# Patient Record
Sex: Male | Born: 1960 | Race: Black or African American | Hispanic: No | Marital: Single | Smoking: Current some day smoker
Health system: Southern US, Community
[De-identification: ages and names within clinical notes are randomized; demographics above are authoritative.]

## PROBLEM LIST (undated history)

## (undated) DIAGNOSIS — B20 Human immunodeficiency virus [HIV] disease: Secondary | ICD-10-CM

## (undated) DIAGNOSIS — F1910 Other psychoactive substance abuse, uncomplicated: Secondary | ICD-10-CM

## (undated) DIAGNOSIS — B49 Unspecified mycosis: Secondary | ICD-10-CM

## (undated) DIAGNOSIS — E785 Hyperlipidemia, unspecified: Secondary | ICD-10-CM

## (undated) DIAGNOSIS — I10 Essential (primary) hypertension: Secondary | ICD-10-CM

## (undated) DIAGNOSIS — Z21 Asymptomatic human immunodeficiency virus [HIV] infection status: Secondary | ICD-10-CM

## (undated) DIAGNOSIS — N200 Calculus of kidney: Secondary | ICD-10-CM

## (undated) HISTORY — PX: CARDIAC SURGERY: SHX584

## (undated) HISTORY — PX: HERNIA REPAIR: SHX51

---

## 2002-07-02 ENCOUNTER — Emergency Department (HOSPITAL_COMMUNITY): Admission: EM | Admit: 2002-07-02 | Discharge: 2002-07-02 | Payer: Self-pay | Admitting: Emergency Medicine

## 2002-07-02 ENCOUNTER — Encounter: Payer: Self-pay | Admitting: Emergency Medicine

## 2003-09-03 ENCOUNTER — Emergency Department (HOSPITAL_COMMUNITY): Admission: EM | Admit: 2003-09-03 | Discharge: 2003-09-03 | Payer: Self-pay | Admitting: Emergency Medicine

## 2012-07-24 HISTORY — PX: KIDNEY STONE SURGERY: SHX686

## 2013-07-28 ENCOUNTER — Encounter (HOSPITAL_COMMUNITY): Payer: Self-pay | Admitting: Emergency Medicine

## 2013-07-28 ENCOUNTER — Emergency Department (HOSPITAL_COMMUNITY)
Admission: EM | Admit: 2013-07-28 | Discharge: 2013-07-29 | Disposition: A | Discharge status: Other Acute Inpatient Hospital | Payer: Medicaid Other | Attending: Emergency Medicine | Admitting: Emergency Medicine

## 2013-07-28 DIAGNOSIS — F1420 Cocaine dependence, uncomplicated: Secondary | ICD-10-CM | POA: Insufficient documentation

## 2013-07-28 DIAGNOSIS — F17200 Nicotine dependence, unspecified, uncomplicated: Secondary | ICD-10-CM | POA: Insufficient documentation

## 2013-07-28 DIAGNOSIS — F1220 Cannabis dependence, uncomplicated: Secondary | ICD-10-CM | POA: Insufficient documentation

## 2013-07-28 DIAGNOSIS — F1920 Other psychoactive substance dependence, uncomplicated: Secondary | ICD-10-CM

## 2013-07-28 DIAGNOSIS — Z79899 Other long term (current) drug therapy: Secondary | ICD-10-CM | POA: Insufficient documentation

## 2013-07-28 DIAGNOSIS — R45850 Homicidal ideations: Secondary | ICD-10-CM | POA: Insufficient documentation

## 2013-07-28 DIAGNOSIS — F1010 Alcohol abuse, uncomplicated: Secondary | ICD-10-CM | POA: Insufficient documentation

## 2013-07-28 DIAGNOSIS — IMO0002 Reserved for concepts with insufficient information to code with codable children: Secondary | ICD-10-CM | POA: Insufficient documentation

## 2013-07-28 HISTORY — DX: Other psychoactive substance abuse, uncomplicated: F19.10

## 2013-07-28 HISTORY — DX: Human immunodeficiency virus (HIV) disease: B20

## 2013-07-28 HISTORY — DX: Hyperlipidemia, unspecified: E78.5

## 2013-07-28 HISTORY — DX: Essential (primary) hypertension: I10

## 2013-07-28 HISTORY — DX: Unspecified mycosis: B49

## 2013-07-28 HISTORY — DX: Asymptomatic human immunodeficiency virus (hiv) infection status: Z21

## 2013-07-28 HISTORY — DX: Calculus of kidney: N20.0

## 2013-07-28 LAB — RAPID URINE DRUG SCREEN, HOSP PERFORMED
Amphetamines: NOT DETECTED
Barbiturates: NOT DETECTED
Benzodiazepines: NOT DETECTED
Cocaine: POSITIVE — AB
OPIATES: NOT DETECTED
Tetrahydrocannabinol: POSITIVE — AB

## 2013-07-28 LAB — CBC WITH DIFFERENTIAL/PLATELET
BASOS ABS: 0 10*3/uL (ref 0.0–0.1)
Basophils Relative: 0 % (ref 0–1)
EOS PCT: 1 % (ref 0–5)
Eosinophils Absolute: 0 10*3/uL (ref 0.0–0.7)
HEMATOCRIT: 39.8 % (ref 39.0–52.0)
Hemoglobin: 14.1 g/dL (ref 13.0–17.0)
LYMPHS PCT: 23 % (ref 12–46)
Lymphs Abs: 1.9 10*3/uL (ref 0.7–4.0)
MCH: 32.6 pg (ref 26.0–34.0)
MCHC: 35.4 g/dL (ref 30.0–36.0)
MCV: 92.1 fL (ref 78.0–100.0)
MONO ABS: 0.7 10*3/uL (ref 0.1–1.0)
Monocytes Relative: 9 % (ref 3–12)
Neutro Abs: 5.4 10*3/uL (ref 1.7–7.7)
Neutrophils Relative %: 68 % (ref 43–77)
Platelets: 224 10*3/uL (ref 150–400)
RBC: 4.32 MIL/uL (ref 4.22–5.81)
RDW: 13.7 % (ref 11.5–15.5)
WBC: 8 10*3/uL (ref 4.0–10.5)

## 2013-07-28 LAB — COMPREHENSIVE METABOLIC PANEL
ALT: 50 U/L (ref 0–53)
AST: 32 U/L (ref 0–37)
Albumin: 4.1 g/dL (ref 3.5–5.2)
Alkaline Phosphatase: 84 U/L (ref 39–117)
BUN: 10 mg/dL (ref 6–23)
CO2: 29 meq/L (ref 19–32)
Calcium: 9.6 mg/dL (ref 8.4–10.5)
Chloride: 104 mEq/L (ref 96–112)
Creatinine, Ser: 1.11 mg/dL (ref 0.50–1.35)
GFR, EST AFRICAN AMERICAN: 88 mL/min — AB (ref >90)
GFR, EST NON AFRICAN AMERICAN: 76 mL/min — AB (ref >90)
GLUCOSE: 102 mg/dL — AB (ref 70–99)
Potassium: 3.5 mEq/L — ABNORMAL LOW (ref 3.7–5.3)
SODIUM: 144 meq/L (ref 137–147)
TOTAL PROTEIN: 7.8 g/dL (ref 6.0–8.3)
Total Bilirubin: 0.3 mg/dL (ref 0.3–1.2)

## 2013-07-28 LAB — ETHANOL: Alcohol, Ethyl (B): <11 mg/dL (ref 0–11)

## 2013-07-28 LAB — ACETAMINOPHEN LEVEL: Acetaminophen (Tylenol), Serum: <15 ug/mL (ref 10–30)

## 2013-07-28 LAB — SALICYLATE LEVEL: Salicylate Lvl: <2 mg/dL — ABNORMAL LOW (ref 2.8–20.0)

## 2013-07-28 MED ORDER — LORAZEPAM 1 MG PO TABS
1.0000 mg | ORAL_TABLET | Freq: Three times a day (TID) | ORAL | Status: DC | PRN
  Administered 2013-07-29: 1 mg via ORAL
  Filled 2013-07-28: qty 1

## 2013-07-28 MED ORDER — ACETAMINOPHEN 325 MG PO TABS: 650.0000 mg | ORAL_TABLET | ORAL | Status: DC | PRN
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
## 2013-07-28 NOTE — ED Notes (Signed)
Report given to Anna, RN 

## 2013-07-28 NOTE — ED Notes (Signed)
Pt states that he is here from [facility] to ge through detox from alcohol, cocaine, and marijauna. Pt states when he is high he does want to hurt others. Pt denies SI.

## 2013-07-28 NOTE — ED Notes (Signed)
Attempted to get pt. To place shoes in a belongings bag.  Pt. States "I don't need them stinking things. I will get my family to get me some more when I get out of here."  Shoes are currently in the trash in the pt.'s room.

## 2013-07-29 ENCOUNTER — Encounter (HOSPITAL_COMMUNITY): Payer: Self-pay | Admitting: *Deleted

## 2013-07-29 ENCOUNTER — Inpatient Hospital Stay (HOSPITAL_COMMUNITY)
Admission: AD | Admit: 2013-07-29 | Discharge: 2013-08-05 | DRG: 897 | Disposition: A | Discharge status: Home / Self Care | Payer: Medicaid Other | Source: Intra-hospital | Attending: Psychiatry | Admitting: Psychiatry

## 2013-07-29 ENCOUNTER — Encounter (HOSPITAL_COMMUNITY): Payer: Self-pay | Admitting: Emergency Medicine

## 2013-07-29 DIAGNOSIS — Z21 Asymptomatic human immunodeficiency virus [HIV] infection status: Secondary | ICD-10-CM | POA: Diagnosis present

## 2013-07-29 DIAGNOSIS — F1994 Other psychoactive substance use, unspecified with psychoactive substance-induced mood disorder: Secondary | ICD-10-CM | POA: Diagnosis present

## 2013-07-29 DIAGNOSIS — Z598 Other problems related to housing and economic circumstances: Secondary | ICD-10-CM

## 2013-07-29 DIAGNOSIS — E7800 Pure hypercholesterolemia, unspecified: Secondary | ICD-10-CM | POA: Diagnosis present

## 2013-07-29 DIAGNOSIS — I10 Essential (primary) hypertension: Secondary | ICD-10-CM | POA: Diagnosis present

## 2013-07-29 DIAGNOSIS — F1920 Other psychoactive substance dependence, uncomplicated: Principal | ICD-10-CM | POA: Diagnosis present

## 2013-07-29 DIAGNOSIS — F411 Generalized anxiety disorder: Secondary | ICD-10-CM | POA: Diagnosis present

## 2013-07-29 DIAGNOSIS — F1910 Other psychoactive substance abuse, uncomplicated: Secondary | ICD-10-CM | POA: Diagnosis present

## 2013-07-29 DIAGNOSIS — R45850 Homicidal ideations: Secondary | ICD-10-CM

## 2013-07-29 DIAGNOSIS — F1410 Cocaine abuse, uncomplicated: Secondary | ICD-10-CM | POA: Diagnosis present

## 2013-07-29 DIAGNOSIS — F17200 Nicotine dependence, unspecified, uncomplicated: Secondary | ICD-10-CM | POA: Diagnosis present

## 2013-07-29 DIAGNOSIS — Z5987 Material hardship due to limited financial resources, not elsewhere classified: Secondary | ICD-10-CM

## 2013-07-29 DIAGNOSIS — F39 Unspecified mood [affective] disorder: Secondary | ICD-10-CM | POA: Diagnosis present

## 2013-07-29 DIAGNOSIS — E785 Hyperlipidemia, unspecified: Secondary | ICD-10-CM | POA: Diagnosis present

## 2013-07-29 DIAGNOSIS — Z23 Encounter for immunization: Secondary | ICD-10-CM

## 2013-07-29 DIAGNOSIS — B353 Tinea pedis: Secondary | ICD-10-CM | POA: Diagnosis present

## 2013-07-29 MED ORDER — VALACYCLOVIR HCL 500 MG PO TABS
500.0000 mg | ORAL_TABLET | Freq: Two times a day (BID) | ORAL | Status: DC
  Administered 2013-07-29 – 2013-08-04 (×13): 500 mg via ORAL
  Filled 2013-07-29 (×11): qty 1
  Filled 2013-07-29: qty 28
  Filled 2013-07-29: qty 1
  Filled 2013-07-29: qty 28
  Filled 2013-07-29 (×5): qty 1

## 2013-07-29 MED ORDER — HYDROCHLOROTHIAZIDE 25 MG PO TABS
ORAL_TABLET | ORAL | Status: AC
  Filled 2013-07-29: qty 1

## 2013-07-29 MED ORDER — ADULT MULTIVITAMIN W/MINERALS CH
ORAL_TABLET | ORAL | Status: AC
  Administered 2013-07-29: 17:00:00 1 via ORAL
  Filled 2013-07-29: qty 1

## 2013-07-29 MED ORDER — TRAZODONE HCL 100 MG PO TABS
100.0000 mg | ORAL_TABLET | Freq: Every evening | ORAL | Status: DC | PRN
  Administered 2013-07-30 – 2013-08-03 (×5): 100 mg via ORAL
  Filled 2013-07-29 (×4): qty 1
  Filled 2013-07-29: qty 14
  Filled 2013-07-29: qty 1

## 2013-07-29 MED ORDER — INFLUENZA VAC SPLIT QUAD 0.5 ML IM SUSP
0.5000 mL | INTRAMUSCULAR | Status: AC
  Administered 2013-07-30: 0.5 mL via INTRAMUSCULAR
  Filled 2013-07-29: qty 0.5

## 2013-07-29 MED ORDER — VALACYCLOVIR HCL 500 MG PO TABS
500.0000 mg | ORAL_TABLET | Freq: Two times a day (BID) | ORAL | Status: DC
  Administered 2013-07-29: 500 mg via ORAL
  Filled 2013-07-29 (×4): qty 1

## 2013-07-29 MED ORDER — CALCIUM CARB-CHOLECALCIFEROL 600-200 MG-UNIT PO TABS: 1.0000 | ORAL_TABLET | Freq: Every day | ORAL | Status: DC

## 2013-07-29 MED ORDER — SIMVASTATIN 40 MG PO TABS
20.0000 mg | ORAL_TABLET | Freq: Every day | ORAL | Status: DC
  Administered 2013-07-29 – 2013-08-04 (×7): 20 mg via ORAL
  Filled 2013-07-29 (×8): qty 1
  Filled 2013-07-29: qty 0.5
  Filled 2013-07-29: qty 1

## 2013-07-29 MED ORDER — CHLORDIAZEPOXIDE HCL 25 MG PO CAPS: 25.0000 mg | ORAL_CAPSULE | Freq: Four times a day (QID) | ORAL | Status: DC | PRN

## 2013-07-29 MED ORDER — HYDROCHLOROTHIAZIDE 25 MG PO TABS
25.0000 mg | ORAL_TABLET | Freq: Every day | ORAL | Status: DC
  Administered 2013-07-29: 25 mg via ORAL
  Filled 2013-07-29: qty 1

## 2013-07-29 MED ORDER — MAGNESIUM HYDROXIDE 400 MG/5ML PO SUSP: 30.0000 mL | Freq: Every day | ORAL | Status: DC | PRN

## 2013-07-29 MED ORDER — EFAVIRENZ-EMTRICITAB-TENOFOVIR 600-200-300 MG PO TABS
1.0000 | ORAL_TABLET | Freq: Every day | ORAL | Status: DC
  Filled 2013-07-29: qty 1

## 2013-07-29 MED ORDER — EFAVIRENZ-EMTRICITAB-TENOFOVIR 600-200-300 MG PO TABS
1.0000 | ORAL_TABLET | Freq: Every day | ORAL | Status: DC
  Administered 2013-07-30 – 2013-08-05 (×7): 1 via ORAL
  Filled 2013-07-29 (×9): qty 1
  Filled 2013-07-29: qty 3

## 2013-07-29 MED ORDER — HYDROCHLOROTHIAZIDE 25 MG PO TABS
25.0000 mg | ORAL_TABLET | Freq: Every day | ORAL | Status: DC
  Administered 2013-07-29 – 2013-08-04 (×7): 25 mg via ORAL
  Filled 2013-07-29 (×6): qty 1
  Filled 2013-07-29: qty 14
  Filled 2013-07-29 (×3): qty 1

## 2013-07-29 MED ORDER — ADULT MULTIVITAMIN W/MINERALS CH
1.0000 | ORAL_TABLET | Freq: Every day | ORAL | Status: DC
Start: 2013-07-29 — End: 2013-08-05
  Administered 2013-07-29 – 2013-08-04 (×7): 1 via ORAL
  Filled 2013-07-29: qty 1
  Filled 2013-07-29: qty 14
  Filled 2013-07-29 (×8): qty 1

## 2013-07-29 MED ORDER — CALCIUM CARBONATE-VITAMIN D 500-200 MG-UNIT PO TABS
1.0000 | ORAL_TABLET | Freq: Every day | ORAL | Status: DC
  Administered 2013-07-30 – 2013-08-04 (×6): 1 via ORAL
  Filled 2013-07-29 (×8): qty 1
  Filled 2013-07-29: qty 14
  Filled 2013-07-29: qty 1

## 2013-07-29 MED ORDER — ACETAMINOPHEN 325 MG PO TABS
650.0000 mg | ORAL_TABLET | Freq: Four times a day (QID) | ORAL | Status: DC | PRN
  Administered 2013-07-30: 650 mg via ORAL
  Filled 2013-07-29: qty 2

## 2013-07-29 MED ORDER — ALUM & MAG HYDROXIDE-SIMETH 200-200-20 MG/5ML PO SUSP
30.0000 mL | ORAL | Status: DC | PRN
Start: 2013-07-29 — End: 2013-08-05

## 2013-07-29 MED ORDER — EFAVIRENZ-EMTRICITAB-TENOFOVIR 600-200-300 MG PO TABS
1.0000 | ORAL_TABLET | Freq: Once | ORAL | Status: AC
  Administered 2013-07-29: 1 via ORAL
  Filled 2013-07-29: qty 1

## 2013-07-29 NOTE — Progress Notes (Signed)
Adult Psychoeducational Group Note  Date:  07/29/2013 Time: 2000  Group Topic/Focus:  Wrap-Up Group:   The focus of this group is to help patients review their daily goal of treatment and discuss progress on daily workbooks.  Participation Level:  Active  Participation Quality:  Attentive and Redirectable  Affect:  Anxious  Cognitive:  Alert and Oriented  Insight: Good  Engagement in Group:  Supportive  Modes of Intervention:  Limit-setting and Support  Additional Comments:  Pt stated that this was his "first time in this environment" and "by the grace of God" he'll be able to get better.   Humberto SealsWhitaker, Ranvir Renovato Monique 07/29/2013, 9:29 PM

## 2013-07-29 NOTE — BH Assessment (Signed)
Assessment staff learned of need for tele assessment during Bridge Call with ED. Writer to complete
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
## 2013-07-29 NOTE — Progress Notes (Signed)
Patient ID: Brigitte PulseSamuel [name], male   DOB: 10/23/1960, 53 y.o.   MRN: [id number] [facility] Living Will, and he does not wish to complete a new one at this time.

## 2013-07-29 NOTE — Progress Notes (Signed)
Recreation Therapy Notes  Animal-Assisted Activity/Therapy (AAA/T) Program Checklist/Progress Notes Patient Eligibility Criteria Checklist & Daily Group note for Rec Tx Intervention  Date: 01.06.2015 Time: 2:45pm [address]    AAA/T Program Assumption of Risk Form signed by Patient/ or Parent Legal Guardian yes  Patient is free of allergies or sever asthma yes  Patient reports no fear of animals yes  Patient reports no history of cruelty to animals yes   Patient understands his/her participation is voluntary yes  Behavioral Response: Did not attend.   [name], LRT/CTRS  Hollyn Stucky L 07/29/2013 4:53 PM

## 2013-07-29 NOTE — Tx Team (Signed)
Initial Interdisciplinary Treatment Plan  PATIENT STRENGTHS: (choose at least two) Ability for insight Average or above average intelligence Capable of independent living Financial means  PATIENT STRESSORS: Legal issue Substance abuse   PROBLEM LIST: Problem List/Patient Goals Date to be addressed Date deferred Reason deferred Estimated date of resolution  Substance abuse 07/29/2013     Homicidal ideations 07/29/2013                                                DISCHARGE CRITERIA:  Improved stabilization in mood, thinking, and/or behavior Need for constant or close observation no longer present Withdrawal symptoms are absent or subacute and managed without 24-hour nursing intervention  PRELIMINARY DISCHARGE PLAN: Attend 12-step recovery group Outpatient therapy  PATIENT/FAMIILY INVOLVEMENT: This treatment plan has been presented to and reviewed with the patient, Ethan [name].  The patient and family have been given the opportunity to ask questions and make suggestions.  Leighton Parodyyson, Danniell Rotundo M 07/29/2013, 4:41 PM

## 2013-07-29 NOTE — BH Assessment (Signed)
Called and spoke with Dr Loretha StaplerWofford before tele assessment; will keep Dr Loretha StaplerWofford informed as to patient needs/disposiotion. Carney Bernatherine C Harrill, LCSW

## 2013-07-29 NOTE — BH Assessment (Signed)
Completed Tele Assessment with patient at 09:15 and ran patient with Fransisca KaufmannLaura Davis NP at [phone] who accepted patient. Charge RN Lupita LeashDonna accepted patient to 303/1. Dr Loretha StaplerWofford informed at 9:50 AM as was patient's RN, Kriste BasqueBecky who will arrange [place] transportation once patient signs supporting paperwork which Ethelene Brownsnthony in Dispositions agreed to complete with patient.  Carney Bernatherine C Nickolus Wadding, LCSW

## 2013-07-30 ENCOUNTER — Encounter (HOSPITAL_COMMUNITY): Payer: Self-pay | Admitting: Psychiatry

## 2013-07-30 DIAGNOSIS — F39 Unspecified mood [affective] disorder: Secondary | ICD-10-CM | POA: Diagnosis present

## 2013-07-30 MED ORDER — QUETIAPINE FUMARATE 50 MG PO TABS
50.0000 mg | ORAL_TABLET | Freq: Four times a day (QID) | ORAL | Status: DC | PRN
  Administered 2013-07-30 – 2013-08-03 (×7): 50 mg via ORAL
  Filled 2013-07-30 (×6): qty 1
  Filled 2013-07-30: qty 30
  Filled 2013-07-30: qty 1

## 2013-07-30 MED ORDER — IBUPROFEN 800 MG PO TABS
800.0000 mg | ORAL_TABLET | Freq: Four times a day (QID) | ORAL | Status: DC | PRN
  Administered 2013-07-30: 800 mg via ORAL
  Filled 2013-07-30: qty 1

## 2013-07-30 NOTE — Progress Notes (Signed)
Recreation Therapy Notes  Date: 01.07.2014 Time: 2:45pm Location: [address] Dayroom   Group Topic: Communication, Team Building, Problem Solving  Goal Area(s) Addresses:  Patient will effectively work with peer towards shared goal.  Patient will identify skill used to make activity successful.  Patient will identify how skills used during activity can be used to reach post d/c goals.   Behavioral Response: Did not attend.   Marykay Lexenise L Rhylen Shaheen, LRT/CTRS  Sarim Rothman L 07/30/2013 5:04 PM

## 2013-07-30 NOTE — [facility] Suicide Risk Assessment (Signed)
Suicide Risk Assessment  Admission Assessment     Nursing information obtained from:  Patient Demographic factors:  Male;Low socioeconomic status;Living alone;Unemployed Current Mental Status:  Thoughts of violence towards others Loss Factors:  NA Historical Factors:  NA Risk Reduction Factors:  NA  CLINICAL FACTORS:   Depression:   Comorbid alcohol abuse/dependence Impulsivity Alcohol/Substance Abuse/Dependencies  COGNITIVE FEATURES THAT CONTRIBUTE TO RISK:  Closed-mindedness Polarized thinking Thought constriction (tunnel vision)    SUICIDE RISK:   Moderate:  Frequent suicidal ideation with limited intensity, and duration, some specificity in terms of plans, no associated intent, good self-control, limited dysphoria/symptomatology, some risk factors present, and identifiable protective factors, including available and accessible social support.  PLAN OF CARE: Supportive approach/coping skills/relapse prevention                               Detox as needed                               Reassess and address the co morbidities/anger manangement  I certify that inpatient services furnished can reasonably be expected to improve the patient's condition.  Lamanda Rudder A 07/30/2013, 1:57 PM
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
## 2013-07-30 NOTE — Progress Notes (Signed)
Adult Psychoeducational Group Note  Date:  07/30/2013 Time:  9:42 PM  Group Topic/Focus:  NA group  Participation Level:  Active  Participation Quality:  Appropriate  Affect:  Appropriate  Cognitive:  Alert  Insight: Appropriate  Engagement in Group:  Engaged  Modes of Intervention:  Discussion  Additional Comments:    Flonnie HailstoneCOOKE, Merland Holness R 07/30/2013, 9:42 PM
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
## 2013-07-30 NOTE — Progress Notes (Signed)
D   Pt is pleasant and cooperative   He attends and participates in groups  And interacts appropriately with others   He demonstrates insight and maintains a positive attitude   He had no complaints and required no PRN medications from me A   Verbal support given   Medications offered and educated on withdrawal symptoms   Q 15 min checks R   Pt safe at present
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
## 2013-07-30 NOTE — [facility] Group Notes (Signed)
[facility] LCSW Group Therapy  07/30/2013 2:55 PM  Type of Therapy:  Group Therapy  Participation Level:  None-PT EXCUSED FROM GROUP WITHIN FIVE MINUTES DUE TO SLEEPING/SNORING  Smart, Ethan [name] LCSWA  07/30/2013, 2:55 PM
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
## 2013-07-31 LAB — T-HELPER CELLS (CD4) COUNT (NOT AT ARMC)
CD4 T CELL HELPER: 40 % (ref 33–55)
CD4 T Cell Abs: 860 /uL (ref 400–2700)

## 2013-07-31 NOTE — Progress Notes (Signed)
Patient ID: Ethan PulseSamuel [name], male   DOB: 05/30/1961, 53 y.o.   MRN: [id number] Pt attended AA group until he disagreed with the  Speaker and walked out mumbling,"That's my clue to leave". It was in reference to not using the name of Jesus.

## 2013-07-31 NOTE — Progress Notes (Signed)
Patient ID: Ethan PulseSamuel Gunkel, male   DOB: 06/12/1961, 53 y.o.   MRN: [id number] Pt did not attend the psycho-educational group on overcoming stress.
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
## 2013-07-31 NOTE — Progress Notes (Signed)
D: pt is calm and cooperative, flirtatious with male staff. Denies si/hi/avh. Denies pain. Pt has not said anything regarding hi towards landlord today. Writer washed pt shave. Pt attended nightly group.  A: medications given. q 15 min safety checks, reminded of therapeutic language, and easily direct able  R: pt remains safe on unit

## 2013-07-31 NOTE — [facility] Suicide Risk Assessment (Signed)
[facility] INPATIENT:  Family/Significant Other Suicide Prevention Education  Suicide Prevention Education:  Education Completed; Reverend Roma KayserFrederick Lucas (pt's reverend and friend) [phone] has been identified by the patient as the family member/significant other with whom the patient will be residing, and identified as the person(s) who will aid the patient in the event of a mental health crisis (suicidal ideations/suicide attempt).  With written consent from the patient, the family member/significant other has been provided the following suicide prevention education, prior to the and/or following the discharge of the patient.  The suicide prevention education provided includes the following:  Suicide risk factors  Suicide prevention and interventions  National Suicide Hotline telephone number  [facility] assessment telephone number  [facility] Emergency Assistance 911  [facility] and/or Residential Mobile Crisis Unit telephone number  Request made of family/significant other to:  Remove weapons (e.g., guns, rifles, knives), all items previously/currently identified as safety concern.    Remove drugs/medications (over-the-counter, prescriptions, illicit drugs), all items previously/currently identified as a safety concern.  The family member/significant other verbalizes understanding of the suicide prevention education information provided.  The family member/significant other agrees to remove the items of safety concern listed above.  Zorita PangReverend Lucas appeared minimally interested in pt's wellbeing but was encouraged to ask questions and list any concerns. Rev. Brogen BoucheLucas is currently handling pt's finances while pt is in treatment. SPE completed with Rev. Perrin BoucheLucas although not required, as pt was not endorsing SI during admission or during [facility]. PT also reports that he is no longer experiencing HI toward his landlords or anyone else. He stated that he plans to call his  landlord to "offer forgiveness and move on."   Smart, HeatherLCSWA  07/31/2013, 12:22 PM

## 2013-07-31 NOTE — [facility] Group Notes (Signed)
[facility] LCSW Group Therapy  07/31/2013 3:19 PM  Type of Therapy:  Group Therapy  Participation Level:  Active  Participation Quality:  Attentive  Affect:  Appropriate  Cognitive:  Oriented  Insight:  Improving  Engagement in Therapy:  Improving  Modes of Intervention:  Confrontation, Discussion, Education, Exploration, Problem-solving, Role-play, Socialization and Support  Summary of Progress/Problems:  Finding Balance in Life. Today's group focused on defining balance in one's own words, identifying things that can knock one off balance, and exploring healthy ways to maintain balance in life. Group members were asked to provide an example of a time when they felt off balance, describe how they handled that situation,and process healthier ways to regain balance in the future. Group members were asked to share the most important tool for maintaining balance that they learned [facility] and how they plan to apply this method after discharge. Ethan [name] was engaged and attentive throughout today's therapy group. He presents with pleasant mood and calm affect. Ethan [name] stated that to him, balance meant "not trusting bad people and being careful." Ethan [name] shows progress in the group setting AEB his ability to actively participate in group discussion. He demonstrates improving insight AEB his ability to process how having positive supports will help in while in recovery. Ethan [name] shared that he plans [facility]. "I feel like I will do well in a christian home with other people going through the same things I am." Ethan [name] identified one thing that he plans to do daily to help him reestablish a sense of balance in life. "I plan to [place] [facility] and [place]. I also plan to stay in the Word and remain weary of others' intentions so that noone takes advantage of me in the future."    Smart, Lebron QuamHeather LCSWA  07/31/2013, 3:19 PM

## 2013-07-31 NOTE — Progress Notes (Signed)
Recreation Therapy Notes  Animal-Assisted Activity/Therapy (AAA/T) Program Checklist/Progress Notes Patient Eligibility Criteria Checklist & Daily Group note for Rec Tx Intervention  Date: 01.08.2014 Time: 2:45pm Location: [address] Dayroom   AAA/T Program Assumption of Risk Form signed by Patient/ or Parent Legal Guardian yes  Patient is free of allergies or sever asthma yes  Patient reports no fear of animals yes  Patient reports no history of cruelty to animals yes   Patient understands his/her participation is voluntary yes  Patient washes hands before animal contact yes  Patient washes hands after animal contact yes  Behavioral Response: Engaged, Appropriate   Education: Hand Washing, Appropriate Animal Interaction   Education Outcome: Acknowledges understanding   Clinical Observations/Feedback: Patient interacted appropriately with therapy dog team.    Cashmere Harmes L Dinh Ayotte, LRT/CTRS  Lira Stephen L 07/31/2013 5:00 PM 

## 2013-07-31 NOTE — Clinical Social Work Note (Signed)
Pt requested that CSW fax letter to his lawyer Corie ChiquitoCandace Marton (fax: [phone]) stating that [facility] and plans to [facility]/hopes to be accepted [facility] after treatment. Pt given original letter and fax confirmation. Fax sent: 3:45PM on 07/31/13. Pt signed ROI for Candace Marton.    The Sherwin-WilliamsHeather Smart, LCSWA 07/31/2013 3:52 PM

## 2013-07-31 NOTE — Progress Notes (Addendum)
D:  Patient's self inventory sheet, patient has fair sleep, improving appetite, low energy level, improving attention span.  Rated depression 8, hopeless 6.  Has experienced chilling in past 24 hours.  Denied SI.  Has experienced lightheadedness, dizziness in past 24 hours.  Worst pain #6.  Plans not to return [facility] after discharge.  No discharge plans.  No problems taking meds after discharge.   "I love Jesus, Christ."  A:  Medications administered per MD orders.  Emotional support and encouragement given patient. R:  Denied SI and HI, contracts for safety.  Denied A/V hallucinations.  Denied pain.  Will continue to monitor patient for safety with 15 minute checks.  Safety maintained.

## 2013-07-31 NOTE — Progress Notes (Signed)
Patient ID: Ethan PulseSamuel Hults, male   DOB: 11/28/1960, 53 y.o.   MRN: [id number] Pt did not attend morning wellness group with RN at 9 am.

## 2013-07-31 NOTE — Clinical Social Work Note (Signed)
Pt informed of [facility] Screening and possible Admission on Tuesday 08/05/13. CSW also provided pt with contact info for [facility] and was encouraged to call this recovery house to schedule phone interview and check for any openings as potential placement for after he [facility]. Pt pleasant and cooperative during interaction and stated that he thinks this would be a good transition after inpatient treatment and a good fit for him. Pt stated that his medications have helped him with mood stabilization. Pt stated that he no longer feels homicidal toward his landlords and plans to call them today to forgive them and tell them that he does not plan to return to the home.   The Sherwin-WilliamsHeather Smart, LCSWA 07/31/2013 10:45 AM

## 2013-08-01 DIAGNOSIS — F411 Generalized anxiety disorder: Secondary | ICD-10-CM

## 2013-08-01 DIAGNOSIS — F3289 Other specified depressive episodes: Secondary | ICD-10-CM

## 2013-08-01 DIAGNOSIS — F329 Major depressive disorder, single episode, unspecified: Secondary | ICD-10-CM

## 2013-08-01 DIAGNOSIS — F39 Unspecified mood [affective] disorder: Secondary | ICD-10-CM

## 2013-08-01 MED ORDER — DM-GUAIFENESIN ER 30-600 MG PO TB12
1.0000 | ORAL_TABLET | Freq: Two times a day (BID) | ORAL | Status: DC
  Administered 2013-08-01 – 2013-08-04 (×8): 1 via ORAL
  Filled 2013-08-01 (×6): qty 1
  Filled 2013-08-01: qty 10
  Filled 2013-08-01: qty 1
  Filled 2013-08-01: qty 10
  Filled 2013-08-01 (×6): qty 1

## 2013-08-01 NOTE — Progress Notes (Signed)
D.  Pt pleasant and appropriate on approach, denies complaints other than occasional insomnia at this time.  Positive for evening AA group with appropriate interaction.  Interacting appropriately within milieu.  Denies SI/HI/hallucinations at present.  A.  Support and encouragement offered, medications given as ordered  R.  Pt remains safe on unit, will continue to monitor.

## 2013-08-01 NOTE — Progress Notes (Signed)
Patient ID: Ethan PulseSamuel Blizzard, male   DOB: 07/01/1961, 53 y.o.   MRN: [id number] D: pt is calm and cooperative. He did make an inappropriate racial remark this am prior to am medications.  Denies si/hi/avh. Denies pain. Pt has not said anything regarding hi towards landlord today. Pt attended groups. He did complain congestion.  A: medications given. RN obtained an order for mucinex for the congestion.  Q 15 min safety checks, reminded of therapeutic language, and easily redirectable.  R: pt remains safe on unit. Pt refused to fill out his inventory sheet.

## 2013-08-01 NOTE — [facility] Group Notes (Signed)
Adult Psychoeducational Group Note  Date:  08/01/2013 Time:  10:33 PM  Group Topic/Focus:  AA Meeting  Participation Level:  Active  Participation Quality:  Appropriate  Affect:  Appropriate  Cognitive:  Appropriate  Insight: Appropriate  Engagement in Group:  Engaged  Modes of Intervention:  Discussion and Education  Additional Comments:  [name] attended Morgan StanleyA meeting.  Caroll RancherLindsay, Shanvi Moyd A 08/01/2013, 10:33 PM

## 2013-08-01 NOTE — [facility] Group Notes (Signed)
[facility] LCS[facility] Discharge Planning Group Note   08/01/2013 9:25 AM  Participation Quality:  Appropriate   Mood/Affect:  Labile  Depression Rating:  6  Anxiety Rating:  6  Thoughts of Suicide:  No Will you contract for safety?   NA  Current AVH:  No  Plan for Discharge/Comments:  Pt reports that he is slightly agitated this morning but states that the medication has helped his irritability significantly. [facility] and plans to [facility] into [facility] after [facility]. [facility]. [place] [place]   Transportation Means: bus   Supports: Education officer, environmentalpastor and friend   Counselling psychologistmart, OncologistHeather LCSWA

## 2013-08-01 NOTE — Progress Notes (Signed)
[facility] Group Notes:  (Nursing/MHT/Case Management/Adjunct)  Date:  07/31/2013  Time:  2100 Type of Therapy:  wrap up group  Participation Level:  Active  Participation Quality:  Attentive, Intrusive, Redirectable, Sharing and Supportive  Affect:  Appropriate  Cognitive:  Alert and Appropriate  Insight:  Improving  Engagement in Group:  Distracting and Engaged  Modes of Intervention:  Clarification, Education and Support  Summary of Progress/Problems:Pt requests information on [facility]. Pt wants to return to [facility] after long term treatment and reorganize a church that he once belonged to.   Shelah LewandowskySquires, Zakaria Sedor Carol 08/01/2013, 2:44 AM
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
## 2013-08-01 NOTE — [facility] Group Notes (Signed)
[facility] LCSW Group Therapy  08/01/2013 3:05 PM  Type of Therapy:  Group Therapy  Participation Level:  Minimal  Participation Quality:  Drowsy  Affect:  Lethargic  Cognitive:  Lacking  Insight:  Limited  Engagement in Therapy:  Limited  Modes of Intervention:  Confrontation, Discussion, Education, Exploration, Socialization and Support  Summary of Progress/Problems: Feelings around Relapse. Group members discussed the meaning of relapse and shared personal stories of relapse, how it affected them and others, and how they perceived themselves during this time. Group members were encouraged to identify triggers, warning signs and coping skills used when facing the possibility of relapse. Social supports were discussed and explored in detail. Post Acute Withdrawal Syndrome (handout provided) was introduced and examined. Pt's were encouraged to ask questions, talk about key points associated with PAWS, and process this information in terms of relapse prevention. Ethan [name] was inattentive and disengaged in the group setting today. He was lethargic and fell asleep during group. Ethan [name] stated that he thinks his medication is making him "groggy." Ethan [name] shows limited progress in the group setting at this time.    Smart, Jaqueline Uber LCSWA  08/01/2013, 3:05 PM

## 2013-08-02 DIAGNOSIS — F1910 Other psychoactive substance abuse, uncomplicated: Secondary | ICD-10-CM

## 2013-08-02 DIAGNOSIS — F39 Unspecified mood [affective] disorder: Secondary | ICD-10-CM

## 2013-08-02 MED ORDER — AMOXICILLIN 500 MG PO CAPS
500.0000 mg | ORAL_CAPSULE | Freq: Three times a day (TID) | ORAL | Status: DC
  Administered 2013-08-02 – 2013-08-05 (×9): 500 mg via ORAL
  Filled 2013-08-02 (×6): qty 1
  Filled 2013-08-02 (×2): qty 17
  Filled 2013-08-02 (×2): qty 1
  Filled 2013-08-02: qty 17
  Filled 2013-08-02 (×4): qty 1

## 2013-08-02 NOTE — Progress Notes (Signed)
[facility] MD Progress Note  08/02/2013 11:28 AM Ethan [name]  MRN:  [id number] Subjective:  Sleep was good, appetite "Ok", depression remains.  He is not feeling well, respiratory congestion, nausea, feeling hot and cold--due to his compromised state, Amoxicillin ordered per his request and his MDs usually give this to him to "clear it up". Diagnosis:   DSM5:  Substance/Addictive Disorders:  Alcohol Related Disorder - Severe (303.90), Alcohol Intoxication with Use Disorder - Severe (F10.229), Alcohol Withdrawal (291.81) and Opioid Disorder - Severe (304.00) Depressive Disorders:  Major Depressive Disorder - Mild (296.21)  Axis I: Anxiety Disorder NOS, Depressive Disorder NOS, Substance Abuse and Substance Induced Mood Disorder Axis II: Deferred Axis III:  Past Medical History  Diagnosis Date  . Substance abuse   . HIV infection   . Hypertension   . Hyperlipemia   . Fungal infection     on hand  . Kidney stone    Axis IV: other psychosocial or environmental problems, problems related to social environment and problems with primary support group Axis V: 41-50 serious symptoms  ADL's:  Intact  Sleep: Good  Appetite:  Fair  Suicidal Ideation:  Plan:  vague Intent:  none Means:  none Homicidal Ideation:  Denies   Psychiatric Specialty Exam: Review of Systems  Constitutional: Negative.   HENT: Negative.   Eyes: Negative.   Respiratory: Negative.   Cardiovascular: Negative.   Gastrointestinal: Negative.   Genitourinary: Negative.   Musculoskeletal: Negative.   Skin: Negative.   Neurological: Negative.   Endo/Heme/Allergies: Negative.   Psychiatric/Behavioral: Positive for depression. The patient is nervous/anxious.     Blood pressure 124/84, pulse 73, temperature 98.3 F (36.8 C), temperature source Oral, resp. rate 20, height 5' 9.29" (1.76 m), weight 104.327 kg (230 lb), SpO2 100.00%.Body mass index is 33.68 kg/(m^2).  General Appearance: Disheveled  Eye Solicitor::   Fair  Speech:  Normal Rate  Volume:  Normal  Mood:  Anxious and Depressed  Affect:  Congruent  Thought Process:  Coherent  Orientation:  Full (Time, Place, and Person)  Thought Content:  WDL  Suicidal Thoughts:  Yes.  without intent/plan  Homicidal Thoughts:  No  Memory:  Immediate;   Fair Recent;   Fair Remote;   Fair  Judgement:  Fair  Insight:  Fair  Psychomotor Activity:  Decreased  Concentration:  Fair  Recall:  Fair  Akathisia:  No  Handed:  Right  AIMS (if indicated):     Assets:  Resilience  Sleep:  Number of Hours: 5.25   Current Medications: Current Facility-Administered Medications  Medication Dose Route Frequency Provider Last Rate Last Dose  . acetaminophen (TYLENOL) tablet 650 mg  650 mg Oral Q6H PRN Fransisca Kaufmann, NP   650 mg at 07/30/13 0555  . alum & mag hydroxide-simeth (MAALOX/MYLANTA) 200-200-20 MG/5ML suspension 30 mL  30 mL Oral Q4H PRN Fransisca Kaufmann, NP      . amoxicillin (AMOXIL) capsule 500 mg  500 mg Oral Q8H Nanine Means, NP      . calcium-vitamin D (OSCAL WITH D) 500-200 MG-UNIT per tablet 1 tablet  1 tablet Oral Q breakfast Rachael Fee, MD   1 tablet at 08/02/13 [phone]  . chlordiazePOXIDE (LIBRIUM) capsule 25 mg  25 mg Oral QID PRN Sanjuana Kava, NP      . dextromethorphan-guaiFENesin (MUCINEX DM) 30-600 MG per 12 hr tablet 1 tablet  1 tablet Oral BID Rachael Fee, MD   1 tablet at 08/02/13 0825  . efavirenz-emtricitabine-tenofovir (ATRIPLA) 600-200-300  MG per tablet 1 tablet  1 tablet Oral QAC breakfast Sanjuana KavaAgnes I Nwoko, NP   1 tablet at 08/02/13 0605  . hydrochlorothiazide (HYDRODIURIL) tablet 25 mg  25 mg Oral Daily Sanjuana KavaAgnes I Nwoko, NP   25 mg at 08/02/13 0825  . ibuprofen (ADVIL,MOTRIN) tablet 800 mg  800 mg Oral Q6H PRN Sanjuana KavaAgnes I Nwoko, NP   800 mg at 07/30/13 1521  . magnesium hydroxide (MILK OF MAGNESIA) suspension 30 mL  30 mL Oral Daily PRN Fransisca KaufmannLaura Davis, NP      . multivitamin with minerals tablet 1 tablet  1 tablet Oral Daily Sanjuana KavaAgnes I Nwoko, NP   1  tablet at 08/02/13 0825  . QUEtiapine (SEROQUEL) tablet 50 mg  50 mg Oral Q6H PRN Rachael FeeIrving A Lugo, MD   50 mg at 08/01/13 2124  . simvastatin (ZOCOR) tablet 20 mg  20 mg Oral q1800 Sanjuana KavaAgnes I Nwoko, NP   20 mg at 08/01/13 1842  . traZODone (DESYREL) tablet 100 mg  100 mg Oral QHS PRN Fransisca KaufmannLaura Davis, NP   100 mg at 08/01/13 2124  . valACYclovir (VALTREX) tablet 500 mg  500 mg Oral BID Rachael FeeIrving A Lugo, MD   500 mg at 08/02/13 0827    Lab Results: No results found for this or any previous visit (from the past 48 hour(s)).  Physical Findings: AIMS: Facial and Oral Movements Muscles of Facial Expression: None, normal Lips and Perioral Area: None, normal Jaw: None, normal Tongue: None, normal,Extremity Movements Upper (arms, wrists, hands, fingers): None, normal Lower (legs, knees, ankles, toes): None, normal, Trunk Movements Neck, shoulders, hips: None, normal, Overall Severity Severity of abnormal movements (highest score from questions above): None, normal Incapacitation due to abnormal movements: None, normal Patient's awareness of abnormal movements (rate only patient's report): No Awareness, Dental Status Current problems with teeth and/or dentures?: No Does patient usually wear dentures?: No  CIWA:  CIWA-Ar Total: 2 COWS:  COWS Total Score: 1  Treatment Plan Summary: Daily contact with patient to assess and evaluate symptoms and progress in treatment Medication management  Plan:  Review of chart, vital signs, medications, and notes. 1-Individual and group therapy 2-Medication management for depression and anxiety:  Medications reviewed with the patient and amoxicillin 500 mg TID ordered for URI 3-Coping skills for depression, anxiety, and substance dependency 4-Continue crisis stabilization and management 5-Address health issues--monitoring vital signs, stable 6-Treatment plan in progress to prevent relapse of depression, substance use, and anxiety  Medical Decision Making Problem  Points:  Established problem, stable/improving (1) and Review of psycho-social stressors (1) Data Points:  Review of medication regiment & side effects (2)  I certify that inpatient services furnished can reasonably be expected to improve the patient's condition.   Nanine MeansLORD, JAMISON, PMH-NP 08/02/2013, 11:28 AM

## 2013-08-02 NOTE — [facility] Group Notes (Signed)
[facility] Group Notes:  (Nursing/MHT/Case Management/Adjunct)  Date:  08/02/2013  Time:  0900  Type of Therapy:  Nurse Education  Participation Level:  Did Not Attend  Participation Quality:  did not attend  Affect:  did not attend  Cognitive:  did not attend  Insight:  None  Engagement in Group:  did not attend  Modes of Intervention:  did not attend  Summary of Progress/Problems:  Ethan [name], Ethan [name] 08/02/2013, 10:34 AM

## 2013-08-02 NOTE — Progress Notes (Signed)
Seen and agreed. Jehad Bisono, MD 

## 2013-08-02 NOTE — [facility] Group Notes (Signed)
[facility] Group Notes: (Clinical Social Work)   08/02/2013      Type of Therapy:  Group Therapy   Participation Level:  Did Not Attend - was initially in the room but left    Ambrose MantleMareida Grossman-Orr, LCSW 08/02/2013, 12:31 PM
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
## 2013-08-03 MED ORDER — TERBINAFINE HCL 1 % EX CREA
TOPICAL_CREAM | Freq: Two times a day (BID) | CUTANEOUS | Status: DC
  Administered 2013-08-03 – 2013-08-04 (×2): via TOPICAL
  Filled 2013-08-03 (×2): qty 12

## 2013-08-03 NOTE — Progress Notes (Signed)
Patient did attend the evening speaker AA meeting.  

## 2013-08-03 NOTE — Progress Notes (Signed)
[facility] Group Notes:  (Nursing/MHT/Case Management/Adjunct)  Date:  08/03/2013  Time:  2:46 PM  Type of Therapy:  Psychoeducational Skills  Participation Level:  Active  Participation Quality:  Attentive and Redirectable  Affect:  Appropriate  Cognitive:  Appropriate  Insight:  Lacking  Engagement in Group:  Distracting, Engaged and Off Topic  Modes of Intervention:  Activity  Summary of Progress/Problems: Pts played a game of Pictionary using coping skills. Pt was inappropriate at times and difficult to redirect.   Caswell CorwinOwen, Daniah Zaldivar C 08/03/2013, 2:46 PM

## 2013-08-03 NOTE — [facility] Group Notes (Signed)
[facility] Group Notes:  (Clinical Social Work)  08/03/2013  10:00-11:00AM  Summary of Progress/Problems:   The main focus of today's process group was to   identify the patient's current support system and decide on other supports that can be put in place.  The picture on workbook was used to discuss why additional supports are needed, and a hand-out was distributed with four definitions/levels of support, then used to talk about how patients have given and received all different kinds of support.  An emphasis was placed on using counselor, doctor, therapy groups, 12-step groups, and problem-specific support groups to expand supports.   There was also an extensive discussion about what constitutes a healthy support versus an unhealthy support.  The patient expressed full comprehension of the concepts presented, and agreed that there is a need to add more supports.  The patient stated the current supports in place are his church and he is willing to add a counselor.  He remained sedated and complained of over-sedation.  Type of Therapy:  Process Group with Motivational Interviewing  Participation Level:  Minimal  Participation Quality:  Drowsy  Affect:  Blunted and Irritable  Cognitive:  Oriented  Insight:  Improving  Engagement in Therapy:  Improving  Modes of Intervention:   Education, Support and Processing, Activity  Ambrose MantleMareida Grossman-Orr, LCSW 08/03/2013, 12:15pm

## 2013-08-03 NOTE — Progress Notes (Signed)
Patient ID: Ethan [name], male   DOB: 1961/02/10, 53 y.o.   MRN: [id number] [facility] MD Progress Note  08/03/2013 6:41 PM Maddax Palinkas  MRN:  [id number]  Subjective:  Shahzain reports that he is ready to go to [facility] in am. He says he is tired of listening to the pill popping devils that call themselves patients. Requested a pair of shoes to wear to [facility] as he has no shoes to wear. Eduardo requested an order for Lamisil for his athletes feet.   Diagnosis:   DSM5: Substance/Addictive Disorders:  Alcohol Related Disorder - Severe (303.90), Alcohol Intoxication with Use Disorder - Severe (F10.229), Alcohol Withdrawal (291.81) and Opioid Disorder - Severe (304.00) Depressive Disorders:  Major Depressive Disorder - Mild (296.21)  Axis I: Anxiety Disorder NOS, Depressive Disorder NOS, Substance Abuse and Substance Induced Mood Disorder Axis II: Deferred Axis III:  Past Medical History  Diagnosis Date  . Substance abuse   . HIV infection   . Hypertension   . Hyperlipemia   . Fungal infection     on hand  . Kidney stone    Axis IV: other psychosocial or environmental problems, problems related to social environment and problems with primary support group Axis V: 41-50 serious symptoms  ADL's:  Intact  Sleep: Good  Appetite:  Fair  Suicidal Ideation:  Plan:  vague Intent:  none Means:  none Homicidal Ideation:  Denies   Psychiatric Specialty Exam: Review of Systems  Constitutional: Negative.   HENT: Negative.   Eyes: Negative.   Respiratory: Negative.   Cardiovascular: Negative.   Gastrointestinal: Negative.   Genitourinary: Negative.   Musculoskeletal: Negative.   Skin: Negative.   Neurological: Negative.   Endo/Heme/Allergies: Negative.   Psychiatric/Behavioral: Positive for depression. The patient is nervous/anxious.     Blood pressure 128/80, pulse 66, temperature 97.4 F (36.3 C), temperature source Oral, resp. rate 18, height 5' 9.29" (1.76  m), weight 104.327 kg (230 lb), SpO2 100.00%.Body mass index is 33.68 kg/(m^2).  General Appearance: Casual  Eye Contact::  Fair  Speech:  Normal Rate  Volume:  Normal  Mood:  Improved  Affect:  Congruent  Thought Process:  Coherent  Orientation:  Full (Time, Place, and Person)  Thought Content:  WDL  Suicidal Thoughts:  No  Homicidal Thoughts:  No  Memory:  Immediate;   Fair Recent;   Fair Remote;   Fair  Judgement:  Fair  Insight:  Fair  Psychomotor Activity:  Normal  Concentration:  Fair  Recall:  Fair  Akathisia:  No  Handed:  Right  AIMS (if indicated):     Assets:  Resilience  Sleep:  Number of Hours: 2.75   Current Medications: Current Facility-Administered Medications  Medication Dose Route Frequency Provider Last Rate Last Dose  . acetaminophen (TYLENOL) tablet 650 mg  650 mg Oral Q6H PRN Fransisca Kaufmann, NP   650 mg at 07/30/13 0555  . alum & mag hydroxide-simeth (MAALOX/MYLANTA) 200-200-20 MG/5ML suspension 30 mL  30 mL Oral Q4H PRN Fransisca Kaufmann, NP      . amoxicillin (AMOXIL) capsule 500 mg  500 mg Oral Q8H Nanine Means, NP   500 mg at 08/03/13 1254  . calcium-vitamin D (OSCAL WITH D) 500-200 MG-UNIT per tablet 1 tablet  1 tablet Oral Q breakfast Rachael Fee, MD   1 tablet at 08/03/13 [phone]  . chlordiazePOXIDE (LIBRIUM) capsule 25 mg  25 mg Oral QID PRN Sanjuana Kava, NP      . dextromethorphan-guaiFENesin [facility]  DM) 30-600 MG per 12 hr tablet 1 tablet  1 tablet Oral BID Rachael FeeIrving A Lugo, MD   1 tablet at 08/03/13 1722  . efavirenz-emtricitabine-tenofovir (ATRIPLA) 600-200-300 MG per tablet 1 tablet  1 tablet Oral QAC breakfast Sanjuana KavaAgnes I Ma Munoz, NP   1 tablet at 08/03/13 [id number]  . hydrochlorothiazide (HYDRODIURIL) tablet 25 mg  25 mg Oral Daily Sanjuana KavaAgnes I Kawanna Christley, NP   25 mg at 08/03/13 0804  . ibuprofen (ADVIL,MOTRIN) tablet 800 mg  800 mg Oral Q6H PRN Sanjuana KavaAgnes I Jolynne Spurgin, NP   800 mg at 07/30/13 1521  . magnesium hydroxide (MILK OF MAGNESIA) suspension 30 mL  30 mL Oral Daily PRN Fransisca KaufmannLaura  Davis, NP      . multivitamin with minerals tablet 1 tablet  1 tablet Oral Daily Sanjuana KavaAgnes I Aydn Ferrara, NP   1 tablet at 08/03/13 0804  . QUEtiapine (SEROQUEL) tablet 50 mg  50 mg Oral Q6H PRN Rachael FeeIrving A Lugo, MD   50 mg at 08/02/13 2208  . simvastatin (ZOCOR) tablet 20 mg  20 mg Oral q1800 Sanjuana KavaAgnes I Porshia Blizzard, NP   20 mg at 08/03/13 1722  . traZODone (DESYREL) tablet 100 mg  100 mg Oral QHS PRN Fransisca KaufmannLaura Davis, NP   100 mg at 08/02/13 2208  . valACYclovir (VALTREX) tablet 500 mg  500 mg Oral BID Rachael FeeIrving A Lugo, MD   500 mg at 08/03/13 1723    Lab Results: No results found for this or any previous visit (from the past 48 hour(s)).  Physical Findings: AIMS: Facial and Oral Movements Muscles of Facial Expression: None, normal Lips and Perioral Area: None, normal Jaw: None, normal Tongue: None, normal,Extremity Movements Upper (arms, wrists, hands, fingers): None, normal Lower (legs, knees, ankles, toes): None, normal, Trunk Movements Neck, shoulders, hips: None, normal, Overall Severity Severity of abnormal movements (highest score from questions above): None, normal Incapacitation due to abnormal movements: None, normal Patient's awareness of abnormal movements (rate only patient's report): No Awareness, Dental Status Current problems with teeth and/or dentures?: No Does patient usually wear dentures?: No  CIWA:  CIWA-Ar Total: 2 COWS:  COWS Total Score: 1  Treatment Plan Summary: Daily contact with patient to assess and evaluate symptoms and progress in treatment Medication management  Plan:  Supportive approach/coping skills/relapse prevention. lamisil 1 % cream to feet areas bid Provided with a pair of free tennis shoes. Encouraged out of room, participation in group sessions and application of coping skills when distressed. Will continue to monitor response to/adverse effects of medications in use to assure effectiveness. Continue to monitor mood, behavior and interaction with staff and other  patients. Discharge plans in progress Continue current plan of care.  Medical Decision Making Problem Points:  Established problem, stable/improving (1) and Review of psycho-social stressors (1) Data Points:  Review of medication regiment & side effects (2)  I certify that inpatient services furnished can reasonably be expected to improve the patient's condition.   Armandina Stammerwoko, Chelcy Bolda I, PMH-NP 08/03/2013, 6:41 PM

## 2013-08-03 NOTE — [facility] Group Notes (Signed)
[facility] Group Notes:  (Nursing/MHT/Case Management/Adjunct)  Date:  08/03/2013  Time:  2:30 PM  Type of Therapy:  Psychoeducational Skills  Participation Level:  Active  Participation Quality:  Appropriate  Affect:  Appropriate  Cognitive:  Appropriate  Insight:  Appropriate  Engagement in Group:  Limited  Modes of Intervention:  Education  Summary of Progress/Problems:Pt left half way through the group stating he had to go and lie down.  Rodman KeyWebb, Cashay Manganelli [facility] 08/03/2013, 2:30 PM

## 2013-08-03 NOTE — Progress Notes (Addendum)
Pt is cooperative. He did attend morning group and does contract for safety. He was in the piano area singing gospel songs this am with some of the other pts.Pt c/o cough and was given a cough drop for this and encouraged to drink fluids. He is now complaining of pain and itching of both feet. NP made aware. Pt does have dead skin in between his toes and on the sides of his feet. He feels he has athletes foot. Pt denies SI or HI. 11:40am -Pt stated he is not used to taking his meds the way we give them to him here and he does not like the way he feels.Instructed pt to discuss this with the NP this am. Pt has been encouraged to drink po and he is complying. 2pm-Pt did attend group this afternoon.

## 2013-08-03 NOTE — Progress Notes (Signed)
[facility] Group Notes:  (Nursing/MHT/Case Management/Adjunct)  Date:  08/02/2013  Time: 2100  Type of Therapy:  wrap up group  Participation Level:  Active  Participation Quality:  Attentive, Intrusive, Redirectable, Sharing and Supportive  Affect:  Appropriate  Cognitive:  Appropriate  Insight:  Appropriate  Engagement in Group:  Engaged  Modes of Intervention:  Clarification, Education and Support  Summary of Progress/Problems:  Ethan [name], Ethan [name] Carol 08/03/2013, 2:40 AM

## 2013-08-03 NOTE — Progress Notes (Signed)
D.  Pt pleasant and bright on approach, feeling somewhat better with his cold tonight since starting on antibiotic.  Denies SI/HI/hallucinations of any kind.  Interacting appropriately within milieu, sang song with peer on piano this evening and did very well.  Positive for evening wrap up group.  A.  Support and encouragement offered  R.  Pt remains safe on unit, will continue to monitor.

## 2013-08-04 MED ORDER — ADULT MULTIVITAMIN W/MINERALS CH: 1.0000 | ORAL_TABLET | Freq: Every day | ORAL | Status: DC

## 2013-08-04 MED ORDER — HYDROCHLOROTHIAZIDE 25 MG PO TABS: 25.0000 mg | ORAL_TABLET | Freq: Every day | ORAL | Status: DC

## 2013-08-04 MED ORDER — EFAVIRENZ-EMTRICITAB-TENOFOVIR 600-200-300 MG PO TABS: 1.0000 | ORAL_TABLET | Freq: Every day | ORAL | Status: DC

## 2013-08-04 MED ORDER — CALCIUM CARB-CHOLECALCIFEROL 600-200 MG-UNIT PO TABS: 1.0000 | ORAL_TABLET | Freq: Every day | ORAL | Status: DC

## 2013-08-04 MED ORDER — TRAZODONE HCL 100 MG PO TABS: 100.0000 mg | ORAL_TABLET | Freq: Every evening | ORAL | Status: DC | PRN

## 2013-08-04 MED ORDER — DM-GUAIFENESIN ER 30-600 MG PO TB12: 1.0000 | ORAL_TABLET | Freq: Two times a day (BID) | ORAL | Status: DC

## 2013-08-04 MED ORDER — QUETIAPINE FUMARATE 50 MG PO TABS: 50.0000 mg | ORAL_TABLET | Freq: Four times a day (QID) | ORAL | Status: DC | PRN

## 2013-08-04 MED ORDER — PRAVASTATIN SODIUM 20 MG PO TABS: 40.0000 mg | ORAL_TABLET | Freq: Every day | ORAL | Status: DC

## 2013-08-04 MED ORDER — TERBINAFINE HCL 1 % EX CREA: TOPICAL_CREAM | Freq: Two times a day (BID) | CUTANEOUS | Status: DC

## 2013-08-04 MED ORDER — AMOXICILLIN 500 MG PO CAPS: 500.0000 mg | ORAL_CAPSULE | Freq: Three times a day (TID) | ORAL | Status: DC

## 2013-08-04 MED ORDER — VALACYCLOVIR HCL 500 MG PO TABS: 500.0000 mg | ORAL_TABLET | Freq: Two times a day (BID) | ORAL | Status: DC

## 2013-08-04 NOTE — Clinical Social Work Note (Signed)
CSW met with pt individually and explained transportation to [facility]. CSW provided pt with bus pass and clear directions with map (see chart). Pt verbalized understanding of directions and was told that [facility] will pick him up at [facility] [place] spoke with Sophronia Simas to arrange this transportation. CSW made PA/MD aware that pt needs 14 day med supply this morning. This is required for pt admission into [facility].    [place], [place] 08/04/2013 2:36 PM

## 2013-08-04 NOTE — Progress Notes (Signed)
Adult Psychoeducational Group Note  Date:  08/04/2013 Time:  2000  Group Topic/Focus:  Wrap-Up Group:   The focus of this group is to help patients review their daily goal of treatment and discuss progress on daily workbooks.  Participation Level:  None  Participation Quality:  None  Affect:  Flat  Cognitive:  Lacking  Insight: None  Engagement in Group:  None  Modes of Intervention:  Exploration  Additional Comments:  Pt attended group but refused to participate.  Humberto SealsWhitaker, Quintez Maselli Monique 08/04/2013, 10:22 PM

## 2013-08-04 NOTE — [facility] Group Notes (Signed)
[facility] LCSW Group Therapy  08/04/2013 2:21 PM  Type of Therapy:  Group Therapy  Participation Level:  Active  Participation Quality:  Attentive  Affect:  Appropriate  Cognitive:  Alert and Oriented  Insight:  Engaged  Engagement in Therapy:  Engaged  Modes of Intervention:  Confrontation, Discussion, Education, Exploration, Socialization and Support  Summary of Progress/Problems: Today's Topic: Overcoming Obstacles. Pt identified obstacles faced currently and processed barriers involved in overcoming these obstacles. Pt identified steps necessary for overcoming these obstacles and explored motivation (internal and external) for facing these difficulties head on. Pt further identified one area of concern in their lives and chose a skill of focus pulled from their "toolbox." [name] was attentive and engaged throughout today's therapy group. He shared that he is nervous about trying something new and going into inpatient treatment. [name] shows progress in the group setting and improving insight AEB his ability to process how [facility] help him maintain sobriety and learn new skills to manage his cravings and addiction. [name] was given a bus pass and map/clear instructions [facility]. He was told by CSW that [facility] [employer] [facility] [address]. Pt verbalized understanding of directions.    Smart, HeatherLCSWA  08/04/2013, 2:21 PM
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
## 2013-08-04 NOTE — Progress Notes (Signed)
Patient ID: Ethan PulseSamuel Piggott, male   DOB: 12/26/1960, 53 y.o.   MRN: [id number] He has been up and to groups interacting with peers and staff.  He did not fill out his self inventory sheet today. He denies SI thoughts and voiced that he was happy to be leaving in the AM. He gets upset with peers who he thinks are just here and not working to get better. Have spoken to him about taking care of himself and to let other care for themselves.

## 2013-08-04 NOTE — Tx Team (Signed)
Interdisciplinary Treatment Plan Update (Adult)  Date: 08/04/2013   Time Reviewed: 10:30 AM  Progress in Treatment:  Attending groups: Yes  Participating in groups:  No.  Taking medication as prescribed: Yes  Tolerating medication: Yes  Family/Significant othe contact made: SPE completed with pt's reverend.   Patient understands diagnosis: Yes, AEB seeking treatment for ETOH detox, crack cocaine abuse, and mood stabilization.  Discussing patient identified problems/goals with staff: Yes  Medical problems stabilized or resolved: Yes  Denies suicidal/homicidal ideation: Pt denies HI/SI at this time. Pt states that he no longer feels HI toward landlord or anyone else. "I am still angry but I am going to forgive him."  Patient has not harmed self or Others: Yes  New problem(s) identified:  Discharge Plan or Barriers: [facility] and likely admission for Tuesday 1/13. He [facility] and [facility] after completion of inpatient treatment.  Additional comments:  Reason for Continuation of Hospitalization: Mood stabilization Medication management  Estimated length of stay: 1 day-pt scheduled for d/c Tuesday at 6:15AM.  For review of initial/current patient goals, please see plan of care.  Attendees:  Patient:    Family:    Physician: Geoffery LyonsIrving Lugo MD 08/04/2013 10:30 AM   Nursing: Lupita Leashonna RN 08/04/2013 10:30 AM   Clinical Social Worker [employer]Heather Smart, LCSWA  08/04/2013 10:30 AM   Other: Altamease OilerNoelle, RN  08/04/2013 10:30 AM   Other:    Other: Massie Kluverelores Sutton, Care Coordinator   08/04/2013 10:30 AM   Other:    Scribe for Treatment Team:  [employer]Heather Smart LCSWA 08/04/2013 10:30 AM
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
## 2013-08-04 NOTE — Progress Notes (Signed)
Recreation Therapy Notes  Date: 01.12.2015 Time: 3:00pm Location: [address] Dayroom  Group Topic: Wellness  Goal Area(s) Addresses:  Patient will identify healthy investments in dimensions of wellness.  Patient will identify importance of balance in all dimensions of wellness.   Behavioral Response: Did not attend. Patient initially came to [address] dayroom and appeared as if he was going to attend group session. Patient then stood in doorway for approximately 5 minutes. LRT invited patient to attend group session, patient declined and left. Patient did not return to group session.   Marykay Lexenise L Saleena Tamas, LRT/CTRS  Taniesha Glanz L 08/04/2013 4:07 PM

## 2013-08-04 NOTE — Progress Notes (Signed)
[facility]charge [facility] :  Will you be returning to the same living situation after discharge: No. [facility]  At discharge, do you have transportation home?:Yes,  bus or reverand Pt d/cing Tuesday AM.  Do you have the ability to pay for your medications:Yes,  Med[place].   Release of information consent forms completed and submitted to Medical Records by CSW.   Patient to Follow up at: Follow-up Information   Follow up with [facility]. (Walk in between 8am-9am Monday through Friday for hospital follow-up/medication management/assessment for therapy services.)    Contact information:   [address] [place], [place] [address] Phone: [phone] Fax: [phone]      Follow up with [facility] On 08/05/2013. ([facility]AM [facility]. [facility] will transport you to facility for screening and possible admission. Please call [phone] when you arrive if they are not in parking lot waiting to pick you up. Bring ID, belongings, and medications supply. )    Contact information:   [address] [place], [place] [address] Phone: [phone] Fax: [phone]      Patient denies SI/HI:   Yes,  during group/self report. See note.    Safety Planning and Suicide Prevention discussed:  Yes,  SPE completed with pt and with pt's reverend. Pt no longer endorses HI and stated that he feels anger toward his landlord but has no intent or plan to hurt him.   Smart, Armani Brar LCSWA  08/04/2013, 10:34 AM

## 2013-08-04 NOTE — Progress Notes (Signed)
D.  Pt pleasant on approach, singing at piano with peer.  Denies SI/HI/hallucinations at this time.  Positive for evening AA group, interacting appropriately within milieu.  Still congested from cold, states that it is worse for him at night with nasal congestion.  A.  Support and encouragement offered, pt continues on antibiotic course  R.  Pt remains safe on unit, will continue to monitor.

## 2013-08-05 NOTE — Progress Notes (Signed)
D    Pt is anxious about discharge   He has been unable to sleep because of same though he said he is looking forward to continued treatment  Pt is loud and sometimes needs redirection A   Verbal support given   Medications administered and effectiveness monitored   Q 15 min checks R   Pt safe at present

## 2013-08-05 NOTE — Progress Notes (Signed)
Pt was discharged left with all follow up information and all belongings  Pt received 2 weeks supply of medication and a hot breakfast   He was transported via security to the bus stop   Pt denies suicidal and homicidal ideation

## 2013-08-05 NOTE — Progress Notes (Signed)
Seen and agreed. Quientin Jent, MD 

## 2013-08-08 ENCOUNTER — Emergency Department (HOSPITAL_COMMUNITY)
Admission: EM | Admit: 2013-08-08 | Discharge: 2013-08-09 | Disposition: A | Discharge status: Other Acute Inpatient Hospital | Payer: Medicaid Other | Source: Home / Self Care | Attending: Emergency Medicine | Admitting: Emergency Medicine

## 2013-08-08 ENCOUNTER — Encounter (HOSPITAL_COMMUNITY): Payer: Self-pay | Admitting: Emergency Medicine

## 2013-08-08 ENCOUNTER — Encounter (HOSPITAL_BASED_OUTPATIENT_CLINIC_OR_DEPARTMENT_OTHER): Payer: Self-pay | Admitting: Emergency Medicine

## 2013-08-08 ENCOUNTER — Emergency Department (HOSPITAL_BASED_OUTPATIENT_CLINIC_OR_DEPARTMENT_OTHER): Payer: Medicaid Other

## 2013-08-08 ENCOUNTER — Emergency Department (HOSPITAL_BASED_OUTPATIENT_CLINIC_OR_DEPARTMENT_OTHER)
Admission: EM | Admit: 2013-08-08 | Discharge: 2013-08-08 | Disposition: A | Discharge status: Home / Self Care | Payer: Medicaid Other | Attending: Emergency Medicine | Admitting: Emergency Medicine

## 2013-08-08 DIAGNOSIS — I10 Essential (primary) hypertension: Secondary | ICD-10-CM | POA: Insufficient documentation

## 2013-08-08 DIAGNOSIS — F17200 Nicotine dependence, unspecified, uncomplicated: Secondary | ICD-10-CM

## 2013-08-08 DIAGNOSIS — R45850 Homicidal ideations: Secondary | ICD-10-CM | POA: Insufficient documentation

## 2013-08-08 DIAGNOSIS — Z8619 Personal history of other infectious and parasitic diseases: Secondary | ICD-10-CM | POA: Insufficient documentation

## 2013-08-08 DIAGNOSIS — F1310 Sedative, hypnotic or anxiolytic abuse, uncomplicated: Secondary | ICD-10-CM | POA: Insufficient documentation

## 2013-08-08 DIAGNOSIS — F1410 Cocaine abuse, uncomplicated: Secondary | ICD-10-CM | POA: Insufficient documentation

## 2013-08-08 DIAGNOSIS — Z21 Asymptomatic human immunodeficiency virus [HIV] infection status: Secondary | ICD-10-CM

## 2013-08-08 DIAGNOSIS — Z79899 Other long term (current) drug therapy: Secondary | ICD-10-CM | POA: Insufficient documentation

## 2013-08-08 DIAGNOSIS — Z792 Long term (current) use of antibiotics: Secondary | ICD-10-CM | POA: Insufficient documentation

## 2013-08-08 DIAGNOSIS — F1210 Cannabis abuse, uncomplicated: Secondary | ICD-10-CM

## 2013-08-08 DIAGNOSIS — Z8639 Personal history of other endocrine, nutritional and metabolic disease: Secondary | ICD-10-CM | POA: Insufficient documentation

## 2013-08-08 DIAGNOSIS — F911 Conduct disorder, childhood-onset type: Secondary | ICD-10-CM | POA: Insufficient documentation

## 2013-08-08 DIAGNOSIS — F411 Generalized anxiety disorder: Secondary | ICD-10-CM

## 2013-08-08 DIAGNOSIS — F39 Unspecified mood [affective] disorder: Secondary | ICD-10-CM | POA: Insufficient documentation

## 2013-08-08 DIAGNOSIS — E785 Hyperlipidemia, unspecified: Secondary | ICD-10-CM | POA: Insufficient documentation

## 2013-08-08 DIAGNOSIS — R45851 Suicidal ideations: Secondary | ICD-10-CM

## 2013-08-08 DIAGNOSIS — K219 Gastro-esophageal reflux disease without esophagitis: Secondary | ICD-10-CM

## 2013-08-08 DIAGNOSIS — Z862 Personal history of diseases of the blood and blood-forming organs and certain disorders involving the immune mechanism: Secondary | ICD-10-CM | POA: Insufficient documentation

## 2013-08-08 DIAGNOSIS — Z87442 Personal history of urinary calculi: Secondary | ICD-10-CM

## 2013-08-08 DIAGNOSIS — Z9889 Other specified postprocedural states: Secondary | ICD-10-CM | POA: Insufficient documentation

## 2013-08-08 DIAGNOSIS — F1910 Other psychoactive substance abuse, uncomplicated: Secondary | ICD-10-CM | POA: Diagnosis present

## 2013-08-08 LAB — CBC WITH DIFFERENTIAL/PLATELET
BASOS ABS: 0 10*3/uL (ref 0.0–0.1)
BASOS PCT: 1 % (ref 0–1)
Band Neutrophils: 1 % (ref 0–10)
Basophils Absolute: 0 10*3/uL (ref 0.0–0.1)
Basophils Relative: 0 % (ref 0–1)
EOS ABS: 0.1 10*3/uL (ref 0.0–0.7)
Eosinophils Absolute: 0 10*3/uL (ref 0.0–0.7)
Eosinophils Relative: 1 % (ref 0–5)
Eosinophils Relative: 1 % (ref 0–5)
HCT: 39.5 % (ref 39.0–52.0)
HCT: 41.2 % (ref 39.0–52.0)
HEMOGLOBIN: 14.6 g/dL (ref 13.0–17.0)
Hemoglobin: 13.6 g/dL (ref 13.0–17.0)
LYMPHS PCT: 39 % (ref 12–46)
Lymphocytes Relative: 38 % (ref 12–46)
Lymphs Abs: 3 10*3/uL (ref 0.7–4.0)
Lymphs Abs: 2 10*3/uL (ref 0.7–4.0)
MCH: 31.4 pg (ref 26.0–34.0)
MCH: 32.4 pg (ref 26.0–34.0)
MCHC: 34.4 g/dL (ref 30.0–36.0)
MCHC: 35.4 g/dL (ref 30.0–36.0)
MCV: 91.2 fL (ref 78.0–100.0)
MCV: 91.4 fL (ref 78.0–100.0)
MONOS PCT: 6 % (ref 3–12)
Monocytes Absolute: 0.5 10*3/uL (ref 0.1–1.0)
Monocytes Absolute: 0.5 10*3/uL (ref 0.1–1.0)
Monocytes Relative: 10 % (ref 3–12)
NEUTROS PCT: 51 % (ref 43–77)
Neutro Abs: 4 10*3/uL (ref 1.7–7.7)
Neutro Abs: 2.7 10*3/uL (ref 1.7–7.7)
Neutrophils Relative %: 53 % (ref 43–77)
Platelets: 253 10*3/uL (ref 150–400)
Platelets: 263 10*3/uL (ref 150–400)
RBC: 4.33 MIL/uL (ref 4.22–5.81)
RBC: 4.51 MIL/uL (ref 4.22–5.81)
RDW: 13.5 % (ref 11.5–15.5)
RDW: 13.8 % (ref 11.5–15.5)
WBC: 7.6 10*3/uL (ref 4.0–10.5)
WBC: 5.4 10*3/uL (ref 4.0–10.5)

## 2013-08-08 LAB — COMPREHENSIVE METABOLIC PANEL
ALT: 84 U/L — AB (ref 0–53)
AST: 87 U/L — AB (ref 0–37)
Albumin: 4 g/dL (ref 3.5–5.2)
Alkaline Phosphatase: 80 U/L (ref 39–117)
BUN: 11 mg/dL (ref 6–23)
CHLORIDE: 100 meq/L (ref 96–112)
CO2: 27 mEq/L (ref 19–32)
Calcium: 9.3 mg/dL (ref 8.4–10.5)
Creatinine, Ser: 0.81 mg/dL (ref 0.50–1.35)
GFR calc Af Amer: >90 mL/min (ref >90)
GFR calc non Af Amer: >90 mL/min (ref >90)
Glucose, Bld: 124 mg/dL — ABNORMAL HIGH (ref 70–99)
POTASSIUM: 3.8 meq/L (ref 3.7–5.3)
SODIUM: 141 meq/L (ref 137–147)
TOTAL PROTEIN: 7.7 g/dL (ref 6.0–8.3)
Total Bilirubin: <0.2 mg/dL — ABNORMAL LOW (ref 0.3–1.2)

## 2013-08-08 LAB — RAPID URINE DRUG SCREEN, HOSP PERFORMED
Amphetamines: NOT DETECTED
BARBITURATES: POSITIVE — AB
Benzodiazepines: NOT DETECTED
COCAINE: NOT DETECTED
Opiates: NOT DETECTED
TETRAHYDROCANNABINOL: NOT DETECTED

## 2013-08-08 LAB — BASIC METABOLIC PANEL
BUN: 12 mg/dL (ref 6–23)
CALCIUM: 9.2 mg/dL (ref 8.4–10.5)
CO2: 29 mEq/L (ref 19–32)
Chloride: 102 mEq/L (ref 96–112)
Creatinine, Ser: 0.9 mg/dL (ref 0.50–1.35)
Glucose, Bld: 103 mg/dL — ABNORMAL HIGH (ref 70–99)
Potassium: 3.8 mEq/L (ref 3.7–5.3)
Sodium: 144 mEq/L (ref 137–147)

## 2013-08-08 LAB — ETHANOL: Alcohol, Ethyl (B): <11 mg/dL (ref 0–11)

## 2013-08-08 LAB — TROPONIN I: Troponin I: <0.3 ng/mL (ref <0.30)

## 2013-08-08 MED ORDER — FAMOTIDINE 20 MG PO TABS: 20.0000 mg | ORAL_TABLET | Freq: Two times a day (BID) | ORAL | Status: DC

## 2013-08-08 MED ORDER — FAMOTIDINE 20 MG PO TABS
20.0000 mg | ORAL_TABLET | Freq: Once | ORAL | Status: AC
  Administered 2013-08-08: 20 mg via ORAL

## 2013-08-08 MED ORDER — GI COCKTAIL ~~LOC~~
ORAL | Status: AC
  Filled 2013-08-08: qty 30

## 2013-08-08 MED ORDER — ALUM & MAG HYDROXIDE-SIMETH 200-200-20 MG/5ML PO SUSP: 30.0000 mL | ORAL | Status: DC | PRN

## 2013-08-08 MED ORDER — ACETAMINOPHEN 325 MG PO TABS: 650.0000 mg | ORAL_TABLET | ORAL | Status: DC | PRN

## 2013-08-08 MED ORDER — GI COCKTAIL ~~LOC~~
30.0000 mL | Freq: Once | ORAL | Status: AC
  Administered 2013-08-08: 30 mL via ORAL

## 2013-08-08 MED ORDER — NICOTINE 21 MG/24HR TD PT24: 21.0000 mg | MEDICATED_PATCH | Freq: Every day | TRANSDERMAL | Status: DC

## 2013-08-08 MED ORDER — ONDANSETRON HCL 4 MG PO TABS: 4.0000 mg | ORAL_TABLET | Freq: Three times a day (TID) | ORAL | Status: DC | PRN

## 2013-08-08 MED ORDER — ZOLPIDEM TARTRATE 5 MG PO TABS
5.0000 mg | ORAL_TABLET | Freq: Every evening | ORAL | Status: DC | PRN
  Administered 2013-08-08: 5 mg via ORAL
  Filled 2013-08-08: qty 1

## 2013-08-08 MED ORDER — IBUPROFEN 400 MG PO TABS: 600.0000 mg | ORAL_TABLET | Freq: Three times a day (TID) | ORAL | Status: DC | PRN

## 2013-08-08 MED ORDER — FAMOTIDINE 20 MG PO TABS
ORAL_TABLET | ORAL | Status: AC
  Filled 2013-08-08: qty 1

## 2013-08-08 NOTE — ED Notes (Signed)
[facility]. He was started on new meds seroquel and trazodone at night. Since then he has had burning in his chest after taking the meds and it stays until he has breakfast the next morning

## 2013-08-08 NOTE — BH Assessment (Signed)
Spoke to [employer] [name], Ethan [name] who said bed 301-1 will be available for Ethan [name] at 0800 on 08/09/13. Notified Ethan CiscoMegan E Docherty, MD and Ethan PileEd White, RN that bed will be available and Pt will be admitted to the service of Dr. Geoffery LyonsIrving [name].  Ethan [name], Ethan [name], [facility] Triage Specialist
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
## 2013-08-08 NOTE — ED Notes (Signed)
No old EKG found  

## 2013-08-08 NOTE — ED Provider Notes (Signed)
53 y.o. Male here for suicidal/homicidal depression.  Received call from Venda RodesFord Warrick, [facility].  States they will continue to look for other facilities.  Plan psych consult for medication management.   Hilario Quarryanielle S Montez Cuda, MD 08/08/13 2100

## 2013-08-08 NOTE — BH Assessment (Signed)
Assessment complete. [facility] there is currently not an appropriate bed available. Consulted with Alberteen SamFran Hobson, NP who [facility] when a bed is available. TTS will contact other facilities for placement. Spoke with Dr. Rosalia Hammersay who requested a tele-psych tonight for medication recommendations. Notified Alberteen SamFran Hobson, NP of tele-psych request.  Harlin RainFord Ellis Patsy BaltimoreWarrick Jr, [facility], [facility] Triage Specialist

## 2013-08-08 NOTE — ED Notes (Signed)
Chest pain.  States he has same pain every night.  Color good  Skin warm and dry.  Alert and oriented.

## 2013-08-08 NOTE — Discharge Instructions (Signed)
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
## 2013-08-08 NOTE — ED Notes (Addendum)
[facility]

## 2013-08-08 NOTE — Progress Notes (Signed)
Patient Discharge Instructions:  After Visit Summary (AVS):   Faxed to:  08/08/13 Discharge Summary Note:   Faxed to:  08/08/13 Psychiatric Admission Assessment Note:   Faxed to:  08/08/13 Suicide Risk Assessment - Discharge Assessment:   Faxed to:  08/08/13 Faxed/Sent to the Next Level Care provider:  08/08/13 [facility]ax[facility] to [facility] @ [phone] [facility] @ [phone]  Jerelene ReddenSheena E [place], 08/08/2013, 3:33 PM

## 2013-08-08 NOTE — ED Notes (Addendum)
EKG corrected from ID# [id number] to WU#[id number], MD Palumbo informed of correction to this EKG

## 2013-08-08 NOTE — ED Notes (Signed)
Pt[facility]

## 2013-08-08 NOTE — ED Provider Notes (Signed)
CSN: [id number]     Arrival date & time 08/08/13  0032 History   First MD Initiated Contact with Patient 08/08/13 0048     Chief Complaint  Patient presents with  . Chest Pain   (Consider location/radiation/quality/duration/timing/severity/associated sxs/prior Treatment) Patient is a 53 y.o. male presenting with chest pain. The history is provided by the patient.  Chest Pain Pain location:  Substernal area (starts in the stomach and goes to the epigastrum) Pain quality: burning   Pain radiates to:  Does not radiate Pain radiates to the back: no   Pain severity:  Moderate Onset quality:  Gradual Duration:  10 days Timing:  Constant Progression:  Waxing and waning Chronicity:  New Context: eating   Relieved by:  Nothing Worsened by:  Nothing tried Ineffective treatments:  None tried Associated symptoms: no fever, no palpitations, no shortness of breath and not vomiting   Risk factors: no surgery   Worst post dinner and lying flat at night  Past Medical History  Diagnosis Date  . Substance abuse   . HIV infection   . Hypertension   . Hyperlipemia   . Fungal infection     on hand  . Kidney stone    Past Surgical History  Procedure Laterality Date  . Hernia repair    . Kidney stone surgery  2014    removed surgically - Dr Lindley Magnus   No family history on file. History  Substance Use Topics  . Smoking status: Current Every Day Smoker  . Smokeless tobacco: Never Used  . Alcohol Use: Yes     Comment: 1/4 gallon daily    Review of Systems  Constitutional: Negative for fever.  Respiratory: Negative for chest tightness and shortness of breath.   Cardiovascular: Positive for chest pain. Negative for palpitations and leg swelling.  Gastrointestinal: Negative for vomiting and blood in stool.  All other systems reviewed and are negative.    Allergies  Review of patient's allergies indicates no known allergies.  Home Medications   Current Outpatient Rx  Name  Route   Sig  Dispense  Refill  . amoxicillin (AMOXIL) 500 MG capsule   Oral   Take 1 capsule (500 mg total) by mouth every 8 (eight) hours. For upper respiratory         . Calcium Carb-Cholecalciferol (CALCIUM 600 + D) 600-200 MG-UNIT TABS   Oral   Take 1 tablet by mouth daily. For low calcium   30 each   0   . dextromethorphan-guaiFENesin (MUCINEX DM) 30-600 MG per 12 hr tablet   Oral   Take 1 tablet by mouth 2 (two) times daily. For productive cough         . efavirenz-emtricitabine-tenofovir (ATRIPLA) 600-200-300 MG per tablet   Oral   Take 1 tablet by mouth at bedtime. For HIV infections         . hydrochlorothiazide (HYDRODIURIL) 25 MG tablet   Oral   Take 1 tablet (25 mg total) by mouth daily. For hypertension         . Multiple Vitamin (MULTIVITAMIN WITH MINERALS) TABS tablet   Oral   Take 1 tablet by mouth daily. For low vitamin         . pravastatin (PRAVACHOL) 20 MG tablet   Oral   Take 2 tablets (40 mg total) by mouth at bedtime. For high cholesterol control         . QUEtiapine (SEROQUEL) 50 MG tablet   Oral   Take 1 tablet (  50 mg total) by mouth every 6 (six) hours as needed (anxiety, agitation). Agitation/mood control   120 tablet   0   . terbinafine (LAMISIL) 1 % cream   Topical   Apply topically 2 (two) times daily. For atheletes foot (May use own medicine)   30 g   0   . traZODone (DESYREL) 100 MG tablet   Oral   Take 1 tablet (100 mg total) by mouth at bedtime as needed for sleep. For sleep   30 tablet   0   . valACYclovir (VALTREX) 500 MG tablet   Oral   Take 1 tablet (500 mg total) by mouth 2 (two) times daily. For herpes          BP 160/100  Pulse 77  Temp(Src) 98.1 F (36.7 C) (Oral)  Resp 20  Ht 5\' 9"  (1.753 m)  Wt 241 lb (109.317 kg)  BMI 35.57 kg/m2  SpO2 99% Physical Exam  Constitutional: He is oriented to person, place, and time. He appears well-developed and well-nourished. No distress.  HENT:  Head: Normocephalic and  atraumatic.  Mouth/Throat: Oropharynx is clear and moist.  Eyes: Conjunctivae are normal. Pupils are equal, round, and reactive to light.  Neck: Normal range of motion. Neck supple.  Cardiovascular: Normal rate, regular rhythm and intact distal pulses.   Pulmonary/Chest: Effort normal. He has no wheezes. He has no rales.  Abdominal: Soft. Bowel sounds are increased. There is no tenderness. There is no rebound and no guarding.  Musculoskeletal: Normal range of motion.  Neurological: He is alert and oriented to person, place, and time. He has normal reflexes.  Skin: Skin is warm and dry. He is not diaphoretic.  Psychiatric: He has a normal mood and affect.    ED Course  Procedures (including critical care time) Labs Review Labs Reviewed  BASIC METABOLIC PANEL - Abnormal; Notable for the following:    Glucose, Bld 103 (*)    All other components within normal limits  CBC WITH DIFFERENTIAL  TROPONIN I   Imaging Review No results found.  EKG Interpretation   None      Wells 0 MDM  No diagnosis found.  Date: 08/08/2013  Rate: 70  Rhythm: normal sinus rhythm  QRS Axis: normal  Intervals: normal  ST/T Wave abnormalities: normal  Conduction Disutrbances: none  Narrative Interpretation: unremarkable      Symptoms are consistent with GERD and given time course 10 days of pain with normal ekg and negative troponin ACS is excluded.  Symptoms resolved with GI cocktail.  Will start pepcid.  Follow up with your PMD for ongoing care  Jalesia Loudenslager K Chimaobi Casebolt-Rasch, MD 08/08/13 [id number]
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
## 2013-08-09 ENCOUNTER — Encounter (HOSPITAL_COMMUNITY): Payer: Self-pay | Admitting: *Deleted

## 2013-08-09 ENCOUNTER — Inpatient Hospital Stay (HOSPITAL_COMMUNITY)
Admission: AD | Admit: 2013-08-09 | Discharge: 2013-08-14 | DRG: 896 | Disposition: A | Discharge status: Home / Self Care | Payer: Medicaid Other | Source: Intra-hospital | Attending: Psychiatry | Admitting: Psychiatry

## 2013-08-09 DIAGNOSIS — F39 Unspecified mood [affective] disorder: Secondary | ICD-10-CM

## 2013-08-09 DIAGNOSIS — I10 Essential (primary) hypertension: Secondary | ICD-10-CM | POA: Diagnosis present

## 2013-08-09 DIAGNOSIS — F1920 Other psychoactive substance dependence, uncomplicated: Principal | ICD-10-CM | POA: Diagnosis present

## 2013-08-09 DIAGNOSIS — B20 Human immunodeficiency virus [HIV] disease: Secondary | ICD-10-CM | POA: Diagnosis present

## 2013-08-09 DIAGNOSIS — Z79899 Other long term (current) drug therapy: Secondary | ICD-10-CM

## 2013-08-09 DIAGNOSIS — F1910 Other psychoactive substance abuse, uncomplicated: Secondary | ICD-10-CM | POA: Diagnosis present

## 2013-08-09 MED ORDER — ADULT MULTIVITAMIN W/MINERALS CH
1.0000 | ORAL_TABLET | Freq: Every day | ORAL | Status: DC
  Administered 2013-08-09 – 2013-08-14 (×6): 1 via ORAL
  Filled 2013-08-09 (×8): qty 1

## 2013-08-09 MED ORDER — QUETIAPINE FUMARATE 50 MG PO TABS
50.0000 mg | ORAL_TABLET | Freq: Four times a day (QID) | ORAL | Status: DC | PRN
  Administered 2013-08-10 – 2013-08-12 (×3): 50 mg via ORAL
  Filled 2013-08-09 (×4): qty 1

## 2013-08-09 MED ORDER — FAMOTIDINE 20 MG PO TABS
20.0000 mg | ORAL_TABLET | Freq: Two times a day (BID) | ORAL | Status: DC
  Administered 2013-08-09 – 2013-08-14 (×10): 20 mg via ORAL
  Filled 2013-08-09 (×14): qty 1

## 2013-08-09 MED ORDER — HYDROCHLOROTHIAZIDE 25 MG PO TABS
ORAL_TABLET | ORAL | Status: AC
  Filled 2013-08-09: qty 1

## 2013-08-09 MED ORDER — MAGNESIUM HYDROXIDE 400 MG/5ML PO SUSP: 30.0000 mL | Freq: Every day | ORAL | Status: DC | PRN

## 2013-08-09 MED ORDER — ACETAMINOPHEN 325 MG PO TABS
650.0000 mg | ORAL_TABLET | Freq: Four times a day (QID) | ORAL | Status: DC | PRN
Start: 2013-08-09 — End: 2013-08-14

## 2013-08-09 MED ORDER — EFAVIRENZ-EMTRICITAB-TENOFOVIR 600-200-300 MG PO TABS
1.0000 | ORAL_TABLET | ORAL | Status: DC
  Administered 2013-08-10 – 2013-08-14 (×5): 1 via ORAL
  Filled 2013-08-09 (×7): qty 1

## 2013-08-09 MED ORDER — ALUM & MAG HYDROXIDE-SIMETH 200-200-20 MG/5ML PO SUSP: 30.0000 mL | ORAL | Status: DC | PRN

## 2013-08-09 MED ORDER — EFAVIRENZ-EMTRICITAB-TENOFOVIR 600-200-300 MG PO TABS
1.0000 | ORAL_TABLET | Freq: Every day | ORAL | Status: DC
  Filled 2013-08-09: qty 1

## 2013-08-09 MED ORDER — ALUM & MAG HYDROXIDE-SIMETH 200-200-20 MG/5ML PO SUSP
30.0000 mL | ORAL | Status: DC | PRN
Start: 2013-08-09 — End: 2013-08-14

## 2013-08-09 MED ORDER — HYDROXYZINE HCL 50 MG PO TABS
50.0000 mg | ORAL_TABLET | Freq: Every evening | ORAL | Status: DC | PRN
  Administered 2013-08-14: 50 mg via ORAL
  Filled 2013-08-09 (×2): qty 1

## 2013-08-09 MED ORDER — VALACYCLOVIR HCL 500 MG PO TABS
500.0000 mg | ORAL_TABLET | Freq: Once | ORAL | Status: DC
  Filled 2013-08-09: qty 1

## 2013-08-09 MED ORDER — QUETIAPINE FUMARATE 25 MG PO TABS: 50.0000 mg | ORAL_TABLET | Freq: Four times a day (QID) | ORAL | Status: DC | PRN

## 2013-08-09 MED ORDER — HYDROCHLOROTHIAZIDE 25 MG PO TABS: 25.0000 mg | ORAL_TABLET | Freq: Once | ORAL | Status: DC

## 2013-08-09 MED ORDER — HYDROCHLOROTHIAZIDE 25 MG PO TABS
25.0000 mg | ORAL_TABLET | Freq: Every day | ORAL | Status: DC
  Administered 2013-08-09 – 2013-08-14 (×6): 25 mg via ORAL
  Filled 2013-08-09 (×2): qty 1
  Filled 2013-08-09: qty 14
  Filled 2013-08-09 (×5): qty 1

## 2013-08-09 MED ORDER — ACETAMINOPHEN 325 MG PO TABS: 650.0000 mg | ORAL_TABLET | Freq: Four times a day (QID) | ORAL | Status: DC | PRN

## 2013-08-09 MED ORDER — ADULT MULTIVITAMIN W/MINERALS CH
ORAL_TABLET | ORAL | Status: AC
  Filled 2013-08-09: qty 1

## 2013-08-09 MED ORDER — SIMVASTATIN 10 MG PO TABS
10.0000 mg | ORAL_TABLET | Freq: Every day | ORAL | Status: DC
Start: 2013-08-09 — End: 2013-08-14
  Administered 2013-08-11 – 2013-08-13 (×3): 10 mg via ORAL
  Filled 2013-08-09 (×7): qty 1

## 2013-08-09 MED ORDER — VALACYCLOVIR HCL 500 MG PO TABS
500.0000 mg | ORAL_TABLET | Freq: Two times a day (BID) | ORAL | Status: DC
  Administered 2013-08-09 – 2013-08-14 (×10): 500 mg via ORAL
  Filled 2013-08-09 (×14): qty 1

## 2013-08-09 MED ORDER — FAMOTIDINE 20 MG PO TABS: 20.0000 mg | ORAL_TABLET | Freq: Once | ORAL | Status: DC

## 2013-08-09 NOTE — Progress Notes (Signed)
D.  Pt pleasant but anxious on approach.  Pt states that his meds were all wrong when he was discharged and so he came back here to get them right.  Pt was very upset with [facility] because he was told that he could not pray in his room to Jesus.  Pt states that employee over there told him that AA does not condone this but that they believe in a "higher power".  Pt stated he was praying in his room and that he felt that this should not have offended anyone.  He was told that his roommate complained.  This has made Pt refuse AA meetings while here and state that he is going to take legal action against [facility] for not upholding his freedom of religion.  Pt did not attend AA group tonight.  Pt denies SI/HI/hallucinations at this time.  A.  Support and encouragement given.  Explained the "higher power" premise to Pt as it is suppose to be (any higher power of Pt's choosing including Jesus).  Called NP on call to get Pt's medications adjusted the way he would like to receive them.  R.  Pt pleased with progress, remains safe on unit.  Will continue to monitor.

## 2013-08-09 NOTE — ED Notes (Signed)
Pt resting with eyes closed.  No complaints.

## 2013-08-09 NOTE — Progress Notes (Signed)
Adult Psychoeducational Group Note  Date:  08/09/2013 Time:  6:38 PM  Group Topic/Focus:  Making Healthy Choices:   The focus of this group is to help patients identify negative/unhealthy choices they were using prior to admission and identify positive/healthier coping strategies to replace them upon discharge.  Participation Level:  Active  Participation Quality:  Appropriate and Attentive  Affect:  Appropriate  Cognitive:  Alert and Appropriate  Insight: Appropriate  Engagement in Group:  Engaged  Modes of Intervention:  Activity  Additional Comments:  Patient participated fully in today's Psycho-Education group. Patient shared verbally with the group, then actively participated in a game of "Healthy Coping Skills" Pictionary.   Ethan [name] 08/09/2013, 6:38 PM

## 2013-08-09 NOTE — ED Provider Notes (Signed)
Pt alert , AMBULATORY, [facility] NO DISTRESS,. ACCEPTED TO [facility] by dr Dub Mikeslugo Results for orders placed during the hospital encounter of 08/08/13  URINE RAPID DRUG SCREEN (HOSP PERFORMED)      Result Value Range   Opiates NONE DETECTED  NONE DETECTED   Cocaine NONE DETECTED  NONE DETECTED   Benzodiazepines NONE DETECTED  NONE DETECTED   Amphetamines NONE DETECTED  NONE DETECTED   Tetrahydrocannabinol NONE DETECTED  NONE DETECTED   Barbiturates POSITIVE (*) NONE DETECTED  CBC WITH DIFFERENTIAL      Result Value Range   WBC 5.4  4.0 - 10.5 K/uL   RBC 4.51  4.22 - 5.81 MIL/uL   Hemoglobin 14.6  13.0 - 17.0 g/dL   HCT 40.941.2  81.139.0 - 91.452.0 %   MCV 91.4  78.0 - 100.0 fL   MCH 32.4  26.0 - 34.0 pg   MCHC 35.4  30.0 - 36.0 g/dL   RDW 78.213.8  95.611.5 - 21.315.5 %   Platelets 263  150 - 400 K/uL   Neutrophils Relative % 51  43 - 77 %   Neutro Abs 2.7  1.7 - 7.7 K/uL   Lymphocytes Relative 38  12 - 46 %   Lymphs Abs 2.0  0.7 - 4.0 K/uL   Monocytes Relative 10  3 - 12 %   Monocytes Absolute 0.5  0.1 - 1.0 K/uL   Eosinophils Relative 1  0 - 5 %   Eosinophils Absolute 0.0  0.0 - 0.7 K/uL   Basophils Relative 1  0 - 1 %   Basophils Absolute 0.0  0.0 - 0.1 K/uL  ETHANOL      Result Value Range   Alcohol, Ethyl (B) <11  0 - 11 mg/dL  COMPREHENSIVE METABOLIC PANEL      Result Value Range   Sodium 141  137 - 147 mEq/L   Potassium 3.8  3.7 - 5.3 mEq/L   Chloride 100  96 - 112 mEq/L   CO2 27  19 - 32 mEq/L   Glucose, Bld 124 (*) 70 - 99 mg/dL   BUN 11  6 - 23 mg/dL   Creatinine, Ser 0.860.81  0.50 - 1.35 mg/dL   Calcium 9.3  8.4 - 57.810.5 mg/dL   Total Protein 7.7  6.0 - 8.3 g/dL   Albumin 4.0  3.5 - 5.2 g/dL   AST 87 (*) 0 - 37 U/L   ALT 84 (*) 0 - 53 U/L   Alkaline Phosphatase 80  39 - 117 U/L   Total Bilirubin <0.2 (*) 0.3 - 1.2 mg/dL   GFR calc non Af Amer >90  >90 mL/min   GFR calc Af Amer >90  >90 mL/min   Dg Chest 2 View  08/08/2013   CLINICAL DATA:  Chest pain.  EXAM: CHEST  2 VIEW  COMPARISON:  None  currently available  FINDINGS: Normal heart size and mediastinal contours. No acute infiltrate or edema. There is minimal atelectatic change or scarring seen in the lateral projection, near the hila. No effusion or pneumothorax. No acute osseous findings.  IMPRESSION: No active cardiopulmonary disease.   Electronically Signed   By: Tiburcio PeaJonathan  Watts M.D.   On: 08/08/2013 02:42     Doug SouSam Tashunda Vandezande, MD 08/09/13 [phone]

## 2013-08-09 NOTE — [facility] Group Notes (Signed)
[facility] Group Notes:  (Nursing/MHT/Case Management/Adjunct)  Date:  08/09/2013  Time:  1:15 pm  Type of Therapy:  Psychoeducational Skills  Participation Level:  Did Not Attend   Ethan [name], Ethan [name] Evans 08/09/2013, 2:34 PM

## 2013-08-09 NOTE — ED Notes (Signed)
Pt. Up and using the bathroom; explained plan of care and verbalized understanding.

## 2013-08-09 NOTE — Progress Notes (Signed)
Patient did not attend the evening speaker AA meeting. Pt remained in his room and walked the hall during group time.
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
## 2013-08-09 NOTE — B[facility] Group Notes (Signed)
[facility] Group Notes:  (Clinical Social Work)  08/09/2013     10-11AM  Summary of Progress/Problems:   The main focus of today's process group was for the patient to identify ways in which they have in the past sabotaged their own recovery. Motivational Interviewing and a worksheet were utilized to help patients explore the perceived benefits and costs of their substance use, as well as the potential benefits and costs of stopping.  The Stages of Change were explained using a handout, and patients identified where they currently are with regard to stages of change.  The patient expressed that he just left this facility and [facility], where he complained that more people were using drugs than when he is at home.  He focused on "they" statements despite continual attempts by CSW to redirect him to "I" statements.  His comments were frightening to some of the other patients who [facility].  He stated he is unsure what to do when he goes home because he has used with a staff person at his residence.  The group urged him to move.  He seemed to take this advice seriously.  Type of Therapy:  Group Therapy - Process   Participation Level:  Active  Participation Quality:  Attentive, Intrusive and Redirectable  Affect:  Appropriate  Cognitive:  Alert  Insight:  Distracting  Engagement in Therapy:  Improving  Modes of Intervention:  Education, Support and Processing, Motivational Interviewing  Ambrose MantleMareida Grossman-Orr, LCSW 08/09/2013, 12:13 PM

## 2013-08-09 NOTE — BHH Counselor (Signed)
Adult Psychosocial Assessment Update Interdisciplinary Team  Previous [facility] admissions/discharges:  Admissions Discharges  Date:  07/29/13 Date:  08/05/13  Date: Date:  Date: Date:  Date: Date:  Date: Date:   Changes since the last Psychosocial Assessment (including adherence to outpatient mental health and/or substance abuse treatment, situational issues contributing to decompensation and/or relapse). The patient states nothing in his life has changed except that he [facility] where they treated him poorly and "found a way" to get rid of him.  He stated he found used needles there, and that people were using drugs there.  He feels they gave him old medication and it made him sick.             Discharge Plan 1. Will you be returning to the same living situation after discharge?   Yes: No:   XX   If no, what is your plan?    The patient would like to go to a different rehab facility.       2. Would you like a referral for services when you are discharged? Yes: XX    If yes, for what services?  No:       He wants to go to a rehab facility, but [facility].       Summary and Recommendations (to be completed by the evaluator) This is a 53yo African American male who was admitted 07/29/13, then [facility].  He asserts a number of things about that facility which are unlikely to be reality-based, such as that they gave him old medicine that made him sick, people were using drugs right in front of him, he found used needles and they found a way to get rid of him.  He is now requesting referral to a different facility.  He is angry and demanding at times, accusational.  His UDS was positive for barbiturates.  He would benefit from safety monitoring, medication evaluation, psychoeducation, group therapy, and discharge planning to link with ongoing resources.                        Signature:   Sarina SerGrossman-Orr, Darriona Dehaas Jo, 08/09/2013 5:39 PM

## 2013-08-09 NOTE — B[facility] Suicide Risk Assessment (Signed)
Suicide Risk Assessment  Admission Assessment     Nursing information obtained from:    Demographic factors:    Current Mental Status:    Loss Factors:    Historical Factors:    Risk Reduction Factors:     CLINICAL FACTORS:   Depression:   Anhedonia Comorbid alcohol abuse/dependence Hopelessness Impulsivity Dysthymia Alcohol/Substance Abuse/Dependencies More than one psychiatric diagnosis Unstable or Poor Therapeutic Relationship Previous Psychiatric Diagnoses and Treatments  COGNITIVE FEATURES THAT CONTRIBUTE TO RISK:  Closed-mindedness Polarized thinking    SUICIDE RISK:   Moderate:  Frequent suicidal ideation with limited intensity, and duration, some specificity in terms of plans, no associated intent, good self-control, limited dysphoria/symptomatology, some risk factors present, and identifiable protective factors, including available and accessible social support.  PLAN OF CARE:  I certify that inpatient services furnished can reasonably be expected to improve the patient's condition.  Ethan [name] 08/09/2013, 11:57 AM

## 2013-08-09 NOTE — ED Notes (Signed)
Encouraged to not speak loud. Stated, "if i cannot have all meds at once i dont' want them."  Stated, "did not get hiv med last night.

## 2013-08-10 MED ORDER — QUETIAPINE FUMARATE 100 MG PO TABS
100.0000 mg | ORAL_TABLET | Freq: Every day | ORAL | Status: DC
  Administered 2013-08-10 – 2013-08-12 (×3): 100 mg via ORAL
  Filled 2013-08-10 (×5): qty 1

## 2013-08-10 NOTE — [facility] Group Notes (Signed)
[facility] Group Notes: (Clinical Social Work)   08/10/2013      Type of Therapy:  Group Therapy   Participation Level:  Did Not Attend    Sandeep Radell Grossman-Orr, LCSW 08/10/2013, 12:54 PM     

## 2013-08-10 NOTE — [facility] Group Notes (Signed)
[facility] Group Notes:  (Nursing/MHT/Case Management/Adjunct)  Date:  08/10/2013  Time:  1:43 PM  Type of Therapy:  Psychoeducational Skills  Participation Level:  Active  Participation Quality:  Attentive  Affect:  Appropriate  Cognitive:  Oriented  Insight:  Appropriate  Engagement in Group:  Engaged  Modes of Intervention:  Clarification, Discussion, Education, Problem-solving and Socialization  Summary of Progress/Problems:Watched video on how behavior effects sobriety.  Inda MerlinWilliams, Robin Pafford R 08/10/2013, 1:43 PM

## 2013-08-10 NOTE — BHH Group Notes (Signed)
[facility] Group Notes:  (Nursing/MHT/Case Management/Adjunct)  Date:  08/10/2013  Time:  1:26 PM  Type of Therapy:  Psychoeducational Skills  Participation Level:  Active  Participation Quality:  Intrusive  Affect:  Irritable  Cognitive:  Oriented  Insight:  Lacking  Engagement in Group:  Limited  Modes of Intervention:  Clarification, Discussion, Education, Problem-solving and Socialization  Summary of Progress/Problems:Discussed treatment questions and concerns.  Worked on workbook for healthy support systems.  Ethan [name], Ethan [name] R 08/10/2013, 1:26 PM

## 2013-08-10 NOTE — Progress Notes (Signed)
08-09-13 NSG NOTE 7a-7p D: Affect is labile, anxious and irritable. Mood is irritable and depressed. Behavior is cooperative with encouragement, direction and support. Interacts appropriately with peers and staff with direction, has problems with one specific male peer. Verbalized in group that he wants to discharged to a [facility] based program.  Reports poor sleep and good appetite. Expressed low energy levels, and improving ability to pay attention. Worked on workbook on healthy support systems and video on how behavior effects sobriety. A: Medications per MD order. Support given throughout day. 1:1 time spent with pt. R: Following treatment plan.  Reports SI.   Denies HI, auditory or visual hallucinations. Contracts for safety.

## 2013-08-10 NOTE — Progress Notes (Signed)
[facility] MD Progress Note  08/10/2013 10:28 AM Ethan [name]  MRN:  [id number] Subjective:  Admitted as he did not wanted to follow with [facility]. Got impulsive and threatening. Walking in the hallway. We stopped trazadone yesterday as he felt sedate with it and it shows allergy to medication.  Says there was a male patient following him last night. He is found to be talking with staff and keeping someone engaged most of the time. Reluctant to be on meds. Says he has used cocaine before. Says just want to be here and discharged to a separate facility. Explained we can not keep him indefinite and need to start on discharge planning. Will re instate seroquel as mood stabilizer which he agreed to  Otherwise wanted to get his HIV meds only. Denies hallucinations. Diagnosis:   DSM5: Schizophrenia Disorders:  Delusional Disorder (297.1) Obsessive-Compulsive Disorders:   Trauma-Stressor Disorders:   Substance/Addictive Disorders:  Polysubstance use, cocaine use disorder per history Depressive Disorders:  Disruptive Mood Dysregulation Disorder (296.99)  Axis I: Substance Induced Mood Disorder and unspecified Mood disorder. Polysubstance dependence. Cocaine use disorder, moderate  ADL's:  Intact  Sleep: Fair  Appetite:  Fair  Suicidal Ideation:  Plan:  denies Intent:  says he would not survive unless he gets to a right follow up treatment facility. Homicidal Ideation:  Plan:  denies Intent:  if someone messes with him. AEB (as evidenced by):  Psychiatric Specialty Exam: Review of Systems  Psychiatric/Behavioral: The patient is nervous/anxious.        Some paranoia    Blood pressure 156/70, pulse 70, temperature 98.3 F (36.8 C), temperature source Oral, resp. rate 18, height 5' 8.5" (1.74 m), weight 109.77 kg (242 lb).Body mass index is 36.26 kg/(m^2).  General Appearance: Casual and Fairly Groomed  Eye Contact::  Good  Speech:  engaged with normal volume  Volume:  Increased  Mood:   labile  Affect:  Labile  Thought Process:  Circumstantial and Intact  Orientation:  Full (Time, Place, and Person)  Thought Content:  Paranoid Ideation and Rumination  Suicidal Thoughts:  No  Homicidal Thoughts:  No  Memory:  Recent;   Fair  Judgement:  Impaired  Insight:  Lacking  Psychomotor Activity:  Normal  Concentration:  Fair  Recall:  Fair  Akathisia:  Negative  Handed:  Right  AIMS (if indicated):     Assets:  Desire for Improvement Vocational/Educational  Sleep:  Number of Hours: 4.25   Current Medications: Current Facility-Administered Medications  Medication Dose Route Frequency Provider Last Rate Last Dose  . acetaminophen (TYLENOL) tablet 650 mg  650 mg Oral Q6H PRN Elmarie Shiley, NP      . alum & mag hydroxide-simeth (MAALOX/MYLANTA) 200-200-20 MG/5ML suspension 30 mL  30 mL Oral Q4H PRN Elmarie Shiley, NP      . efavirenz-emtricitabine-tenofovir (ATRIPLA) 350-093-818 MG per tablet 1 tablet  1 tablet Oral BH-q7a Elmarie Shiley, NP   1 tablet at 08/10/13 0600  . famotidine (PEPCID) tablet 20 mg  20 mg Oral BID Elmarie Shiley, NP   20 mg at 08/10/13 0905  . hydrochlorothiazide (HYDRODIURIL) tablet 25 mg  25 mg Oral Daily Elmarie Shiley, NP   25 mg at 08/10/13 2993  . hydrOXYzine (ATARAX/VISTARIL) tablet 50 mg  50 mg Oral QHS PRN Elmarie Shiley, NP      . magnesium hydroxide (MILK OF MAGNESIA) suspension 30 mL  30 mL Oral Daily PRN Elmarie Shiley, NP      . multivitamin with minerals tablet 1  tablet  1 tablet Oral Daily Elmarie Shiley, NP   1 tablet at 08/10/13 0905  . QUEtiapine (SEROQUEL) tablet 100 mg  100 mg Oral QHS Merian Capron, MD      . QUEtiapine (SEROQUEL) tablet 50 mg  50 mg Oral Q6H PRN Elmarie Shiley, NP      . simvastatin (ZOCOR) tablet 10 mg  10 mg Oral q1800 Elmarie Shiley, NP      . valACYclovir (VALTREX) tablet 500 mg  500 mg Oral BID Elmarie Shiley, NP   500 mg at 08/10/13 6734    Lab Results:  Results for orders placed during the hospital encounter of 08/08/13 (from the past 48  hour(s))  CBC WITH DIFFERENTIAL     Status: None   Collection Time    08/08/13  3:44 PM      Result Value Range   WBC 5.4  4.0 - 10.5 K/uL   RBC 4.51  4.22 - 5.81 MIL/uL   Hemoglobin 14.6  13.0 - 17.0 g/dL   HCT 41.2  39.0 - 52.0 %   MCV 91.4  78.0 - 100.0 fL   MCH 32.4  26.0 - 34.0 pg   MCHC 35.4  30.0 - 36.0 g/dL   RDW 13.8  11.5 - 15.5 %   Platelets 263  150 - 400 K/uL   Neutrophils Relative % 51  43 - 77 %   Neutro Abs 2.7  1.7 - 7.7 K/uL   Lymphocytes Relative 38  12 - 46 %   Lymphs Abs 2.0  0.7 - 4.0 K/uL   Monocytes Relative 10  3 - 12 %   Monocytes Absolute 0.5  0.1 - 1.0 K/uL   Eosinophils Relative 1  0 - 5 %   Eosinophils Absolute 0.0  0.0 - 0.7 K/uL   Basophils Relative 1  0 - 1 %   Basophils Absolute 0.0  0.0 - 0.1 K/uL  ETHANOL     Status: None   Collection Time    08/08/13  3:44 PM      Result Value Range   Alcohol, Ethyl (B) <11  0 - 11 mg/dL   Comment:            LOWEST DETECTABLE LIMIT FOR     SERUM ALCOHOL IS 11 mg/dL     FOR MEDICAL PURPOSES ONLY  COMPREHENSIVE METABOLIC PANEL     Status: Abnormal   Collection Time    08/08/13  3:44 PM      Result Value Range   Sodium 141  137 - 147 mEq/L   Potassium 3.8  3.7 - 5.3 mEq/L   Chloride 100  96 - 112 mEq/L   CO2 27  19 - 32 mEq/L   Glucose, Bld 124 (*) 70 - 99 mg/dL   BUN 11  6 - 23 mg/dL   Creatinine, Ser 0.81  0.50 - 1.35 mg/dL   Calcium 9.3  8.4 - 10.5 mg/dL   Total Protein 7.7  6.0 - 8.3 g/dL   Albumin 4.0  3.5 - 5.2 g/dL   AST 87 (*) 0 - 37 U/L   Comment: SLIGHT HEMOLYSIS   ALT 84 (*) 0 - 53 U/L   Alkaline Phosphatase 80  39 - 117 U/L   Total Bilirubin <0.2 (*) 0.3 - 1.2 mg/dL   GFR calc non Af Amer >90  >90 mL/min   GFR calc Af Amer >90  >90 mL/min   Comment: (NOTE)     The eGFR has been  calculated using the CKD EPI equation.     This calculation has not been validated in all clinical situations.     eGFR's persistently <90 mL/min signify possible Chronic Kidney     Disease.  URINE RAPID  DRUG SCREEN (HOSP PERFORMED)     Status: Abnormal   Collection Time    08/08/13  3:48 PM      Result Value Range   Opiates NONE DETECTED  NONE DETECTED   Cocaine NONE DETECTED  NONE DETECTED   Benzodiazepines NONE DETECTED  NONE DETECTED   Amphetamines NONE DETECTED  NONE DETECTED   Tetrahydrocannabinol NONE DETECTED  NONE DETECTED   Barbiturates POSITIVE (*) NONE DETECTED   Comment:            DRUG SCREEN FOR MEDICAL PURPOSES     ONLY.  IF CONFIRMATION IS NEEDED     FOR ANY PURPOSE, NOTIFY LAB     WITHIN 5 DAYS.                LOWEST DETECTABLE LIMITS     FOR URINE DRUG SCREEN     Drug Class       Cutoff (ng/mL)     Amphetamine      1000     Barbiturate      200     Benzodiazepine   220     Tricyclics       254     Opiates          300     Cocaine          300     THC              50    Physical Findings: AIMS: Facial and Oral Movements Muscles of Facial Expression: None, normal Lips and Perioral Area: None, normal Jaw: None, normal Tongue: None, normal,Extremity Movements Upper (arms, wrists, hands, fingers): None, normal Lower (legs, knees, ankles, toes): None, normal, Trunk Movements Neck, shoulders, hips: None, normal, Overall Severity Severity of abnormal movements (highest score from questions above): None, normal Incapacitation due to abnormal movements: None, normal Patient's awareness of abnormal movements (rate only patient's report): No Awareness, Dental Status Current problems with teeth and/or dentures?: No Does patient usually wear dentures?: No  CIWA:  CIWA-Ar Total: 0 COWS:     Treatment Plan Summary: Daily contact with patient to assess and evaluate symptoms and progress in treatment Medication management Add Seroquel 157m at night for mood stabilization and paranoia. Attend cognitive and support groups.  Plan:  Medical Decision Making Problem Points:  Established problem, worsening (2), Review of last therapy session (1) and Review of  psycho-social stressors (1) Data Points:  Review or order clinical lab tests (1) Review of medication regiment & side effects (2) Review of new medications or change in dosage (2)  I certify that inpatient services furnished can reasonably be expected to improve the patient's condition.   Aries Townley 08/10/2013, 10:28 AM

## 2013-08-10 NOTE — Progress Notes (Signed)
Patient did not attend the evening speaker AA meeting.  

## 2013-08-10 NOTE — Progress Notes (Signed)
Adult Psychoeducational Group Note  Date:  08/10/2013 Time:  5:09 PM  Group Topic/Focus:  Dimensions of Wellness:   The focus of this group is to introduce the topic of wellness and discuss the role each dimension of wellness plays in total health.  Participation Level:  Minimal  Participation Quality:  Drowsy and Inattentive  Affect:  Sleepy (in and out of sleep)  Cognitive:  Sleepy (in and out of sleep)  Insight: None  Engagement in Group:  Lacking  Modes of Intervention:  Education, Exploration and Socialization  Additional Comments:  Ethan [name] participated in group, but only on a very minimal level. Patient was asleep for most of group, however he would wake up and share when it was his turn. Patient reflected what he had learned in earlier groups, however was unable to absorb any new information or ideas due to his sleepiness.   Einar GipDeann M Johnson 08/10/2013, 5:09 PM

## 2013-08-10 NOTE — Progress Notes (Signed)
D.  Pt came up to nurse's station and stated that he was going on to bed early because "I don't want any drama".  Pt stated that if he was going to take his ordered bedtime Seroquel that was ordered he would have to take it now because he was planning to just get a snack and go to bed.  Denies SI/HI/hallucinations at this time.  Pt did not go to evening AA group.  Had talk with daytime RNs and did better after this with interaction but still cursing on hall and somewhat entitled.  A.  Gave Seroquel early as per Pt request and informed NP of this.  Support and encouragement offered.  R.  Pt states that he felt the Seroquel "messed me up"   Pt states "I don't want any more of that"    Pt did not stay in room, came back up to the nurse's station.  Will continue to monitor.

## 2013-08-11 DIAGNOSIS — F1994 Other psychoactive substance use, unspecified with psychoactive substance-induced mood disorder: Secondary | ICD-10-CM

## 2013-08-11 DIAGNOSIS — F1420 Cocaine dependence, uncomplicated: Secondary | ICD-10-CM

## 2013-08-11 DIAGNOSIS — F1920 Other psychoactive substance dependence, uncomplicated: Principal | ICD-10-CM

## 2013-08-11 MED ORDER — POTASSIUM CHLORIDE CRYS ER 10 MEQ PO TBCR
10.0000 meq | EXTENDED_RELEASE_TABLET | Freq: Every day | ORAL | Status: DC
  Administered 2013-08-11 – 2013-08-14 (×4): 10 meq via ORAL
  Filled 2013-08-11: qty 1
  Filled 2013-08-11: qty 14
  Filled 2013-08-11 (×4): qty 1

## 2013-08-11 NOTE — [facility] Group Notes (Signed)
[facility] LCSW Group Therapy  08/11/2013 4:12 PM  Type of Therapy:  Group Therapy  Participation Level:  Active  Participation Quality:  Monopolizing  Affect:  Labile  Cognitive:  Alert  Insight:  Limited  Engagement in Therapy:  Monopolizing  Modes of Intervention:  Confrontation, Discussion, Education, Exploration, Limit-setting, Rapport Building, Socialization and Support  Summary of Progress/Problems: Today's Topic: Overcoming Obstacles. Pt identified obstacles faced currently and processed barriers involved in overcoming these obstacles. Pt identified steps necessary for overcoming these obstacles and explored motivation (internal and external) for facing these difficulties head on. Pt further identified one area of concern in their lives and chose a skill of focus pulled from their "toolbox." [name] was engaged and attentive throughout today's therapy group. He shared that he is running into obstacles regarding "finding a christian based treatment center." CSW clarified the difference between treatment center and recovery house. Other pts provided pt with information to various Christian based recovery homes including [facility], where pt is hoping to go after [facility]. Pt shows limited insight and limited progress in the group setting AEB his lability and monopolization of group discussion. Pt required frequent redirection and was asked to stop interrupting others and to limit leaving the group room.    Smart, Kijuana Ruppel LCSWA  08/11/2013, 4:12 PM

## 2013-08-11 NOTE — [facility] Group Notes (Signed)
[facility] Note   08/11/2013 10:01 AM  Participation Quality:  None-pt refused to speak in group setting and demanded to speak with CSW privately. CSW informed pt that this was d/c planning group and if he chose not to talk about his d/c plan, CSW could meet with him later today. Pt verbalized understanding. He was disruptive throughout group and was asked to stop side conversations on several occassions.   Mood/Affect:  Irritable and Labile  Depression Rating:  Refused   Anxiety Rating:  Refused   Smart, Sherlon Nied LCSWA

## 2013-08-11 NOTE — Tx Team (Signed)
Interdisciplinary Treatment Plan Update (Adult)  Date: 08/11/2013   Time Reviewed: 10:03 AM  Progress in Treatment:  Attending groups: Yes  Participating in groups:  No. Pt is disruptive and labile in mood but does not actively participate in group discussion.  Taking medication as prescribed: Yes  Tolerating medication: Yes  Family/Significant othe contact made: Not yet. SPE required for this pt. Pt also endorsing passive HI without intent/plan at this time.  Patient understands diagnosis: Yes, AEB seeking treatment for mood stabilization, substance abuse issues, and passive HI.  Discussing patient identified problems/goals with staff: Yes  Medical problems stabilized or resolved: Yes  Denies suicidal/homicidal ideation: Pt endorsing "passive HI" without intent, plan, or target individual.  Patient has not harmed self or Others: Yes  New problem(s) identified:  Discharge Plan or Barriers: Pt recently "kicked out" of [facility] and is hoping for placement in 'christian recovery home.' CSW provided pt with contact information to [facility] and pt was encouraged to call and complete phone interview. [facility].  Additional comments: Ethan [name] is an 53 y.o. male, single, African-American who [facility] unaccompanied to [facility]. [facility] following treatment for polysubstance dependence and unspecified mood disorder and [facility]. The patient reports that he was treated poorly at the treatment center where he was referred stating "I am so glad to be back here. It was terrible there. They insisted I take medications that were outdated which made my stomach hurt. I found needles used to inject drugs and when I told the staff they found a way to get rid of me. I just need to get my medical medications right at least. I will ask to be sent to another treatment center that is right for me.  Just really angry about what happened." Ethan [name] is very loud during his admission assessment making many accusations that were unlikely to have happened [facility]. It is not clear what actually took place after the [facility]. Ethan [name] reports that due to his fear of relapsing [facility] to [facility] his sobriety. He became somewhat demanding when telling this Provider how he feels that his medications should be ordered. The patient's mood appear to be labile at this time. His UDS is noted to be positive for barbiturates.  Reason for Continuation of Hospitalization: Mood stabilization HI Medication management  Estimated length of stay: 3-5 days  For review of initial/current patient goals, please see plan of care.  Attendees:  Patient:    Family:    Physician:    Nursing: Maury DusDonna RN 08/11/2013 10:03 AM  Clinical Social Worker Celia Gibbons Smart, LCSWA  08/11/2013 10:03 AM   Other: Chandra BatchAggie N. PA  08/11/2013 10:03 AM   Other:    Other:    Other:    Scribe for Treatment Team:  The Sherwin-WilliamsHeather Smart LCSWA 08/11/2013 10:03 AM

## 2013-08-11 NOTE — Progress Notes (Addendum)
D: Patient in the dayroom on approach.  Patient loud and speaking loudly about the other patients.  Patient was told not to behave this was but patient states he does not care.  Patient states there is racial tension on the unit and no one is doing anything about it.  Patient states he is not being treated fairly because if he were white we would find him a place for him to go.  Patient states he is passive SI but contracts for safety.  Patient denies HI and denies AVH. A: Staff to monitor Q 15 mins for safety.  Encouragement and support offered.  Scheduled medications administered per orders.  Patient refused medications tonight. R: Patient remains safe on the unit.  Patient attended group tonight.  Patient visible on the unit and interacting with peers.  Patient refused Seroquel tonight stating "Yall just want to drug me up, that ain't nothing but the devil."  Then patient came back later to take medications.

## 2013-08-11 NOTE — Clinical Social Work Note (Signed)
CSW met with pt individually to discuss pt's aftercare plan and his behavior with staff and pts while at [facility]. Pt was defensive and agitated during meeting, stating that he is being singled out and is not guilty of treating people badly. Pt reports that other pts are targeting him and complaining to staff. Pt agreeable to going to his room when he feels that his temper/anger is getting out of control. Pt denies SI/HI and states that he is hoping to get into [facility] and then transition into [facility] or another [place] based recovery house. Pt signed release allowing CSW to send referral to [facility]. Pt and CSW contacted [facility] together and was told that Christia Reading from [facility] would call pt back to do phone interview.   [place], LCSWA 08/11/2013 4:16 PM

## 2013-08-11 NOTE — Progress Notes (Addendum)
Patient ID: Ethan PulseSamuel [name], male   DOB: 06/13/1961, 53 y.o.   MRN: [id number] He has been up and to groups interacting with peers and staff. Has upset several pts with his language and demanding way. This AM has been more appropriate this PM.  Self inventory this AM : depression  And hopelessness 3, denies withdrawal symptoms, positive SI contracts for safety.  Said that he had called  Where he was going and was to call back at 09:00.

## 2013-08-11 NOTE — Progress Notes (Signed)
Patient ID: Ethan [name], male   DOB: 05-06-1961, 53 y.o.   MRN: [id number] [facility] MD Progress Note  08/11/2013 11:26 AM Rob Mciver  MRN:  [id number] Subjective:  Patient stated his medications were working "fine" and ordered that his medications not be changed at all.  Blood pressure elevated at times and HCTZ ordered with potassium and encouraged him to take this medication due to his hypertension.  Sleep "good", appetite is "fine".  Depression is "very bad because I don't know where I will be going.  I have to have a [place] based rehab because AA does not work for me.  I am a child of God."   Diagnosis:   DSM5: Schizophrenia Disorders:  Delusional Disorder (297.1) Substance/Addictive Disorders:  Polysubstance use, cocaine use disorder per history Depressive Disorders:  Disruptive Mood Dysregulation Disorder (296.99)  Axis I: Substance Induced Mood Disorder and unspecified Mood disorder. Polysubstance dependence. Cocaine use disorder, moderate  ADL's:  Intact  Sleep: Fair  Appetite:  Fair  Suicidal Ideation:  Plan:  denies Intent:  says he would not survive unless he gets to a right follow up treatment facility. Homicidal Ideation:  Plan:  denies Intent:  if someone messes with him. AEB (as evidenced by):  Psychiatric Specialty Exam: Review of Systems  Psychiatric/Behavioral: The patient is nervous/anxious.        Some paranoia    Blood pressure 149/91, pulse 90, temperature 97.6 F (36.4 C), temperature source Oral, resp. rate 19, height 5' 8.5" (1.74 m), weight 109.77 kg (242 lb).Body mass index is 36.26 kg/(m^2).  General Appearance: Casual and Fairly Groomed  Eye Contact::  Good  Speech:  engaged with normal volume  Volume:  Increased  Mood:  labile  Affect:  Labile  Thought Process:  Circumstantial and Intact  Orientation:  Full (Time, Place, and Person)  Thought Content:  Paranoid Ideation and Rumination  Suicidal Thoughts:  No  Homicidal Thoughts:  No  Memory:   Recent;   Fair  Judgement:  Impaired  Insight:  Lacking  Psychomotor Activity:  Normal  Concentration:  Fair  Recall:  Fair  Akathisia:  Negative  Handed:  Right  AIMS (if indicated):     Assets:  Desire for Improvement Vocational/Educational  Sleep:  Number of Hours: 6.25   Current Medications: Current Facility-Administered Medications  Medication Dose Route Frequency Provider Last Rate Last Dose  . acetaminophen (TYLENOL) tablet 650 mg  650 mg Oral Q6H PRN Fransisca Kaufmann, NP      . alum & mag hydroxide-simeth (MAALOX/MYLANTA) 200-200-20 MG/5ML suspension 30 mL  30 mL Oral Q4H PRN Fransisca Kaufmann, NP      . efavirenz-emtricitabine-tenofovir (ATRIPLA) 600-200-300 MG per tablet 1 tablet  1 tablet Oral BH-q7a Fransisca Kaufmann, NP   1 tablet at 08/11/13 0602  . famotidine (PEPCID) tablet 20 mg  20 mg Oral BID Fransisca Kaufmann, NP   20 mg at 08/11/13 7829  . hydrochlorothiazide (HYDRODIURIL) tablet 25 mg  25 mg Oral Daily Fransisca Kaufmann, NP   25 mg at 08/11/13 5621  . hydrOXYzine (ATARAX/VISTARIL) tablet 50 mg  50 mg Oral QHS PRN Fransisca Kaufmann, NP      . magnesium hydroxide (MILK OF MAGNESIA) suspension 30 mL  30 mL Oral Daily PRN Fransisca Kaufmann, NP      . multivitamin with minerals tablet 1 tablet  1 tablet Oral Daily Fransisca Kaufmann, NP   1 tablet at 08/11/13 0839  . QUEtiapine (SEROQUEL) tablet 100 mg  100 mg Oral QHS Nadeem  Gilmore LarocheAkhtar, MD   100 mg at 08/10/13 1951  . QUEtiapine (SEROQUEL) tablet 50 mg  50 mg Oral Q6H PRN Fransisca KaufmannLaura Davis, NP   50 mg at 08/10/13 1033  . simvastatin (ZOCOR) tablet 10 mg  10 mg Oral q1800 Fransisca KaufmannLaura Davis, NP      . valACYclovir (VALTREX) tablet 500 mg  500 mg Oral BID Fransisca KaufmannLaura Davis, NP   500 mg at 08/11/13 [id number]    Lab Results:  No results found for this or any previous visit (from the past 48 hour(s)).  Physical Findings: AIMS: Facial and Oral Movements Muscles of Facial Expression: None, normal Lips and Perioral Area: None, normal Jaw: None, normal Tongue: None, normal,Extremity  Movements Upper (arms, wrists, hands, fingers): None, normal Lower (legs, knees, ankles, toes): None, normal, Trunk Movements Neck, shoulders, hips: None, normal, Overall Severity Severity of abnormal movements (highest score from questions above): None, normal Incapacitation due to abnormal movements: None, normal Patient's awareness of abnormal movements (rate only patient's report): No Awareness, Dental Status Current problems with teeth and/or dentures?: No Does patient usually wear dentures?: No  CIWA:  CIWA-Ar Total: 0 COWS:     Treatment Plan Summary: Daily contact with patient to assess and evaluate symptoms and progress in treatment Medication management Add Seroquel 100mg  at night for mood stabilization and paranoia. Attend cognitive and support groups.  Plan:  Review of chart, vital signs, medications, and notes. 1-Individual and group therapy 2-Medication management for depression and anxiety:  Medications reviewed with the patient and HCTZ interviewed with potassium ordered for HTN 3-Coping skills for depression, anxiety, and substance dependency 4-Continue crisis stabilization and management 5-Address health issues--monitoring vital signs, stable 6-Treatment plan in progress to prevent relapse of depression, substance abuse, and anxiety  Medical Decision Making Problem Points:  Established problem, worsening (2), Review of last therapy session (1) and Review of psycho-social stressors (1) Data Points:  Review or order clinical lab tests (1) Review of medication regiment & side effects (2) Review of new medications or change in dosage (2)  I certify that inpatient services furnished can reasonably be expected to improve the patient's condition.   Nanine MeansLORD, JAMISON, PMH-NP 08/11/2013, 11:26 AM  I agreed with findings and treatment plan of this patient Thedore MinsMojeed Mikaeel Petrow, MD

## 2013-08-11 NOTE — Progress Notes (Signed)
Recreation Therapy Notes  Date: 01.19.2015 Time: 3:00pm Location: [address]   Group Topic: Coping Skills  Goal Area(s) Addresses:  Patient will identify benefit of using coping skill.  Patient will identify ability to positively change life by using coping skills.   Behavioral Response: Did not attend.   Marykay Lexenise L Haeli Gerlich, LRT/CTRS  Jearl KlinefelterBlanchfield, [place] Droll L 08/11/2013 4:39 PM

## 2013-08-12 DIAGNOSIS — F1410 Cocaine abuse, uncomplicated: Secondary | ICD-10-CM

## 2013-08-12 DIAGNOSIS — F39 Unspecified mood [affective] disorder: Secondary | ICD-10-CM

## 2013-08-12 NOTE — Progress Notes (Signed)
D: Patient in the hallway on approach.  Patient answers questions before writer can ask.  Patient states, "I am suicidal and I want to hurt some MFers', I already know what you are about to say I am here so they can get me some help."  Patent tangential and states he is blessed and he is not going to let these devils get to me.  Patient has been vocal and loud on the unit and upset other patients with his speech.  Patient states he has active SI as long as he is getting treatment.  Patient states he wants to hurt others but does not specify who.  Patient denies AVH.  Patient calm and cooperative talking to staff but loud and aggressive verbally.  Patient states the medication he is prescribed at night is crazy medicain and it slows him down.  Patient states he is not here to eat all the snacks and get doped up he wants help. A: Staff to monitor Q 15 mins for safety.  Encouragement and support offered.  Scheduled medications administered per orders. R: Patient remains safe on the unit.  Patient attended group tonight.  Patient visible on the unit and interacting with peers.  Patient taking administered medications

## 2013-08-12 NOTE — Clinical Social Work Note (Addendum)
Per pt request, CSW called and left message for his case worker Duanne MoronSharon Lipton [phone] in order to get information about her progress with assisting pt with housing. Pt stated that he was told to call [facility] at 2pm today. [facility] denied pt due to pending court dates and history of aggression. Pt not eligible for [facility] due to recent stay and was told to leave due to behavior. Pt continues to demonstrate labile mood and demanding behaviors. CSW has spoken with pt multiple times about his behavior on the unit. Pt denies SI/HI today.   Rasul Decola Smart, LCSWA 08/12/2013 11:16 AM    Message left for Cathleen FearsBrenda Gregory requesting that she visit with pt to discuss options  (pt has history of aggression and pending charges making placement difficult). He is currently homeless.   The Sherwin-WilliamsHeather Smart, LCSWA 08/12/2013 11:23 AM

## 2013-08-12 NOTE — Progress Notes (Signed)
Patient ID: Ethan PulseSamuel [name], male   DOB: 03/10/1961, 53 y.o.   MRN: [id number] He has been up and to part of the groups. Has been calmer less aggressive argumentive  after he was talked to this AM by director. He has been observed on phone and said he needed to call again in the AM.  He has been interacting talking to staff and peers. Has been out and about on unit.

## 2013-08-12 NOTE — BHH Group Notes (Signed)
Adult Psychoeducational Group Note  Date:  08/12/2013 Time:  11:35 PM  Group Topic/Focus:  Wrap-Up Group:   The focus of this group is to help patients review their daily goal of treatment and discuss progress on daily workbooks.  Participation Level:  Active  Participation Quality:  Appropriate  Affect:  Appropriate  Cognitive:  Appropriate  Insight: Appropriate  Engagement in Group:  Engaged  Modes of Intervention:  Discussion  Additional Comments:  Ethan [name] stated his day was fine and blessed.  He expressed he is here because he needs help.  He also stated that he says things for [name] reason and that he likes everyone, he just doesn't like what he sees.  He said that he has [name] power of attorney now and hopes to get this house tomorrow.  Ethan [name], Ethan [name] 08/12/2013, 11:35 PM

## 2013-08-12 NOTE — B[facility] Group Notes (Signed)
B[facility] LCSW Group Therapy  08/12/2013 1:37 PM  Type of Therapy:  Group Therapy  Participation Level:  Did Not Attend-pt in phone room speaking with [facility]/phone interview  Smart, Lebron QuamHeather LCSWA 08/12/2013, 1:37 PM

## 2013-08-12 NOTE — Progress Notes (Signed)
Recreation Therapy Notes  Animal-Assisted Activity/Therapy (AAA/T) Program Checklist/Progress Notes Patient Eligibility Criteria Checklist & Daily Group note for Rec Tx Intervention  Date: 01.20.2015 Time: 2:45pm Location: [address] Day[address]    AAA/T Program Assumption of Risk Form signed by Patient/ or Parent Legal Guardian yes  Patient is free of allergies or sever asthma yes  Patient reports no fear of animals yes  Patient reports no history of cruelty to animals yes   Patient understands his/her participation is voluntary yes  Behavioral Response: Did not attend.   Shalea Tomczak L Jelissa Espiritu, LRT/CTRS  Denaisha Swango L 08/12/2013 4:30 PM 

## 2013-08-12 NOTE — Progress Notes (Signed)
Patient ID: Ethan [name], male   DOB: 12/29/1960, 53 y.o.   MRN: [id number] Patient ID: Ethan [name], male   DOB: 08/28/1960, 53 y.o.   MRN: [id number] [facility] MD Progress Note  08/12/2013 4:06 PM Ethan [name]  MRN:  [id number]  Subjective:  Ethan [name] is expressing suicidal ideations. He says he feels like jumping out in front of a moving train. Says [facility] because there were more crack,drug/acohol activities going on there than the streets. He is currently [facility] after [facility] or someone give him some money to get him an apartment. He says his mood is just fine.  Diagnosis:   DSM5: Schizophrenia Disorders:  Delusional Disorder (297.1) Substance/Addictive Disorders:  Polysubstance use, cocaine use disorder per history Depressive Disorders:  Disruptive Mood Dysregulation Disorder (296.99)  Axis I: Substance Induced Mood Disorder and unspecified Mood disorder. Polysubstance dependence. Cocaine use disorder, moderate  ADL's:  Intact  Sleep: Fair  Appetite:  Fair  Suicidal Ideation:  Plan:  denies Intent:  says he would not survive unless he gets to a right follow up treatment facility. Homicidal Ideation:  Plan:  denies Intent:  if someone messes with him. AEB (as evidenced by):  Psychiatric Specialty Exam: Review of Systems  Psychiatric/Behavioral: The patient is nervous/anxious.        Some paranoia    Blood pressure 151/74, pulse 79, temperature 97.3 F (36.3 C), temperature source Oral, resp. rate 22, height 5' 8.5" (1.74 m), weight 109.77 kg (242 lb).Body mass index is 36.26 kg/(m^2).  General Appearance: Casual and Fairly Groomed  Patent attorneyye Contact::  Good  Speech:  Clear and Coherent and Normal Rate  Volume:  Normal  Mood:  labile  Affect:  Labile  Thought Process:  Circumstantial and Intact  Orientation:  Full (Time, Place, and Person)  Thought Content:  Paranoid Ideation and Rumination  Suicidal Thoughts:  No   Homicidal Thoughts:  No  Memory:  Recent;   Fair  Judgement:  Impaired  Insight:  Lacking  Psychomotor Activity:  Normal  Concentration:  Fair  Recall:  Fair  Akathisia:  Negative  Handed:  Right  AIMS (if indicated):     Assets:  Desire for Improvement Vocational/Educational  Sleep:  Number of Hours: 3.25   Current Medications: Current Facility-Administered Medications  Medication Dose Route Frequency Provider Last Rate Last Dose  . acetaminophen (TYLENOL) tablet 650 mg  650 mg Oral Q6H PRN Fransisca KaufmannLaura Davis, NP      . alum & mag hydroxide-simeth (MAALOX/MYLANTA) 200-200-20 MG/5ML suspension 30 mL  30 mL Oral Q4H PRN Fransisca KaufmannLaura Davis, NP      . efavirenz-emtricitabine-tenofovir (ATRIPLA) 600-200-300 MG per tablet 1 tablet  1 tablet Oral BH-q7a Fransisca KaufmannLaura Davis, NP   1 tablet at 08/12/13 0553  . famotidine (PEPCID) tablet 20 mg  20 mg Oral BID Fransisca KaufmannLaura Davis, NP   20 mg at 08/12/13 0806  . hydrochlorothiazide (HYDRODIURIL) tablet 25 mg  25 mg Oral Daily Fransisca KaufmannLaura Davis, NP   25 mg at 08/12/13 [id number]  . hydrOXYzine (ATARAX/VISTARIL) tablet 50 mg  50 mg Oral QHS PRN Fransisca KaufmannLaura Davis, NP      . magnesium hydroxide (MILK OF MAGNESIA) suspension 30 mL  30 mL Oral Daily PRN Fransisca KaufmannLaura Davis, NP      . multivitamin with minerals tablet 1 tablet  1 tablet Oral Daily Fransisca KaufmannLaura Davis, NP   1 tablet at 08/12/13 0806  . potassium chloride (K-DUR,KLOR-CON) CR tablet 10 mEq  10  mEq Oral Daily Nanine Means, NP   10 mEq at 08/12/13 0806  . QUEtiapine (SEROQUEL) tablet 100 mg  100 mg Oral QHS Thresa Ross, MD   100 mg at 08/11/13 2346  . QUEtiapine (SEROQUEL) tablet 50 mg  50 mg Oral Q6H PRN Fransisca Kaufmann, NP   50 mg at 08/12/13 1026  . simvastatin (ZOCOR) tablet 10 mg  10 mg Oral q1800 Fransisca Kaufmann, NP   10 mg at 08/11/13 1729  . valACYclovir (VALTREX) tablet 500 mg  500 mg Oral BID Fransisca Kaufmann, NP   500 mg at 08/12/13 1610    Lab Results:  No results found for this or any previous visit (from the past 48 hour(s)).  Physical  Findings: AIMS: Facial and Oral Movements Muscles of Facial Expression: None, normal Lips and Perioral Area: None, normal Jaw: None, normal Tongue: None, normal,Extremity Movements Upper (arms, wrists, hands, fingers): None, normal Lower (legs, knees, ankles, toes): None, normal, Trunk Movements Neck, shoulders, hips: None, normal, Overall Severity Severity of abnormal movements (highest score from questions above): None, normal Incapacitation due to abnormal movements: None, normal Patient's awareness of abnormal movements (rate only patient's report): No Awareness, Dental Status Current problems with teeth and/or dentures?: No Does patient usually wear dentures?: No  CIWA:  CIWA-Ar Total: 0 COWS:     Treatment Plan Summary: Daily contact with patient to assess and evaluate symptoms and progress in treatment Medication management Add Seroquel 100mg  at night for mood stabilization and paranoia. Attend cognitive and support groups.  Plan:  Review of chart, vital signs, medications, and notes. 1-Individual and group therapy 2-Medication management for depression and anxiety:  Medications reviewed with the patient, continue HCTZ with potassium supplement ordered for HTN 3-Coping skills for depression, anxiety, and substance dependency 4-Continue crisis stabilization and management 5-Address health issues--monitoring vital signs, stable 6-Treatment plan in progress to prevent relapse of depression, substance abuse, and anxiety Medical Decision Making Problem Points:  Established problem, worsening (2), Review of last therapy session (1) and Review of psycho-social stressors (1) Data Points:  Review or order clinical lab tests (1) Review of medication regiment & side effects (2) Review of new medications or change in dosage (2)  I certify that inpatient services furnished can reasonably be expected to improve the patient's condition.   Armandina Stammer I, PMH-NP 08/12/2013, 4:06 PM

## 2013-08-13 NOTE — [facility] Group Notes (Signed)
[facility]   08/13/2013 9:34 AM  Participation Quality:  Appropriate   Mood/Affect:  Appropriate  Depression Rating:  2  Anxiety Rating:  8-worried about living situation.   Thoughts of Suicide:  No Will you contract for safety?   NA  Current AVH:  No  Plan for Discharge/Comments:  Pt reports that he has another intake call with [facility] at 10am this morning and "it sounds hopeful." Pt also requested that his pastor call [facility] on his behalf.   Transportation Means: pastor   Supports: Psychologist, occupationalpastor  Smart, Research scientist (physical sciences)Gracious Renken

## 2013-08-13 NOTE — B[facility] Suicide Risk Assessment (Signed)
[facility] INPATIENT:  Family/Significant Other Suicide Prevention Education  Suicide Prevention Education:  Patient Refusal for Family/Significant Other Suicide Prevention Education: The patient Ethan [name] has refused to provide written consent for family/significant other to be provided Family/Significant Other Suicide Prevention Education during admission and/or prior to discharge.  Physician notified.   Pt [facility] but refused to consent to family/friend contact in order to complete SPE. Pt did not want CSW to complete SPE with pastor. SPE completed with pt. SPI pamphlet provided to pt and he was encouraged to share information with support network, ask questions, and talk about any concerns relating to SPE. Pt does not endorse SI or HI and verbalized understanding of steps to take if he feels SI/HI at any point in the future. Pt stated that during this visit, he did not feel HI toward anyone or have a plan, he "just felt overwhelmed and frustrated."   Smart, Sharita Bienaime LCSWA  08/13/2013, 11:34 AM

## 2013-08-13 NOTE — Progress Notes (Signed)
D: Patient presents with blunted, irritable affect and depressed mood. He rated depression and feelings of hopelessness "3". Patient attending groups today and have been trying to speak with [facility] regarding placement. He's visible in the milieu. In adherence with medication regimen.  A: Support and encouragement provided to patient. Scheduled medications administered per MD orders. Maintain Q15 minute checks for safety.  R: Patient receptive. Passive SI, but contracts for safety. Denies HI and auditory/visual hallucinations. Patient remains safe.

## 2013-08-13 NOTE — BHH Group Notes (Signed)
Adult Psychoeducational Group Note  Date:  08/13/2013 Time:  10:04 PM  Group Topic/Focus:  AA Meeting  Participation Level:  Active  Participation Quality:  Appropriate  Affect:  Appropriate  Cognitive:  Appropriate  Insight: Limited  Engagement in Group:  Limited  Modes of Intervention:  Discussion and Education  Additional Comments:  Ethan [name] attended [employer]A meeting.  Ethan [name], Ethan [name] A 08/13/2013, 10:04 PM

## 2013-08-13 NOTE — Clinical Social Work Note (Signed)
Pt accepted into [facility] for tomorrow, Thursday 08/13/13. CSW [facility] and was told to call back tomorrow morning at 9am to verify transportation is still needed for pt. Pt notified.   The Sherwin-WilliamsHeather Smart, LCSWA 08/13/2013 11:45 AM

## 2013-08-13 NOTE — Progress Notes (Signed)
D: Patient in the hallway on approach.  Patient mood is labile.  Patient was talking to Clinical research associatewriter with no problem and after group tonight he began to use profanity and tell Clinical research associatewriter that she had no right to say anything to him.  Patient was standing in the doorway of another patient's room and writer informed him that he is unable to go into another patients room.  Patient has been calling staff names and stated one of the RN's was "a house nigga."   Patient is racially preoccupied."   Patient was informed that this behavior will not be tolerated.  Patient passive SI but verbally contracts for safety.  Patient denies HI and denies AVH.    A: Staff to monitor Q 15 mins for safety.  Encouragement and support offered. No Scheduled medications administered per orders.  Vistaril administered prn for sleep. R: Patient remains safe on the unit.  Patient attended group tonight. Patient visible on the unit and interacting with peers.  Patient taking administered medications.

## 2013-08-13 NOTE — B[facility] Group Notes (Signed)
B[facility] LCSW Group Therapy  08/13/2013 3:33 PM  Type of Therapy:  Group Therapy  Participation Level:  Active  Participation Quality:  Attentive  Affect:  Appropriate  Cognitive:  Alert and Oriented  Insight:  Improving  Engagement in Therapy:  Improving  Modes of Intervention:  Confrontation, Discussion, Education, Exploration, Problem-solving, Rapport Building, Socialization and Support  Summary of Progress/Problems: Emotion Regulation: This group focused on both positive and negative emotion identification and allowed group members to process ways to identify feelings, regulate negative emotions, and find healthy ways to manage internal/external emotions. Group members were asked to reflect on a time when their reaction to an emotion led to a negative outcome and explored how alternative responses using emotion regulation would have benefited them. Group members were also asked to discuss a time when emotion regulation was utilized when a negative emotion was experienced.  Ethan [name] was attentive and engaged throughout today's therapy group. Ethan [name] shared that he struggles with regulating his anger, "especially when I am accused of things that I haven't done." Ethan [name] stated that he feels dizzy and his heart pounds when he is angry and reports difficulty regulating his emotions. "I usually lash out at people when it gets that bad." Ethan [name] demonstrates progress in the group setting and improving insight AEB his ability to process how his anger causes long term problems for him-substance abuse, legal troubles, difficulty in finding placement, etc. Ethan [name] shared that his faith helps him get back on track and playing with animals helps him to calm down.    Smart, Krithi Bray LCSWA  08/13/2013, 3:33 PM

## 2013-08-13 NOTE — Progress Notes (Signed)
Patient ID: Ethan PulseSamuel [name], male   DOB: 03/31/1961, 53 y.o.   MRN: [id number] Patient ID: Ethan PulseSamuel [name], male   DOB: 07/29/1960, 53 y.o.   MRN: [id number] Patient ID: Ethan PulseSamuel [name], male   DOB: 12/13/1960, 53 y.o.   MRN: [id number] [facility]  08/13/2013 2:16 PM Ethan [name]  MRN:  [id number]  Subjective:  Ethan DeterSamuel is expressing that he feels better today knowing his [facility]  He say he is relieved because this residence is drug and alcohol free. He adds that being in a place like [facility] will help him deal with his depression better. He asked for all and or any psychotropic medications started on him here be discontinued because he does not need them any more. He states that he prefers to stay on the medications prescribed to him by his medical doctor for his other medical conditions. He currently denies any suicidal, homicidal ideations, AVH, delusions and or paranoia. Denies any substance withdrawal symptoms.  Diagnosis:   DSM5: Schizophrenia Disorders:  Delusional Disorder (297.1) Substance/Addictive Disorders:  Polysubstance use, cocaine use disorder per history Depressive Disorders:  Disruptive Mood Dysregulation Disorder (296.99)  Axis I: Substance Induced Mood Disorder and unspecified Mood disorder. Polysubstance dependence. Cocaine use disorder, moderate  ADL's:  Intact  Sleep: Good  Appetite:  Fair  Suicidal Ideation:  Plan:  Denies Intent:  Denies Means:  Denies Homicidal Ideation:  Plan:  Denies Intent:  Denies Means:  Denies AEB (as evidenced by):  Psychiatric Specialty Exam: Review of Systems  Psychiatric/Behavioral: The patient is nervous/anxious.        Some paranoia    Blood pressure 139/90, pulse 99, temperature 98 F (36.7 C), temperature source Oral, resp. rate 20, height 5' 8.5" (1.74 m), weight 109.77 kg (242 lb).Body mass index is 36.26 kg/(m^2).  General Appearance: Casual and Fairly Groomed  Proofreaderye  Contact::  Good  Speech:  Clear and Coherent and Normal Rate  Volume:  Normal  Mood:  labile  Affect:  Labile  Thought Process:  Intact  Orientation:  Full (Time, Place, and Person)  Thought Content:  Paranoid Ideation and Rumination  Suicidal Thoughts:  No  Homicidal Thoughts:  No  Memory:  Recent;   Fair  Judgement:  Impaired  Insight:  Lacking  Psychomotor Activity:  Normal  Concentration:  Fair  Recall:  Fair  Akathisia:  Negative  Handed:  Right  AIMS (if indicated):     Assets:  Desire for Improvement Vocational/Educational  Sleep:  Number of Hours: 6.25   Current Medications: Current Facility-Administered Medications  Medication Dose Route Frequency Provider Last Rate Last Dose  . acetaminophen (TYLENOL) tablet 650 mg  650 mg Oral Q6H PRN Fransisca KaufmannLaura Davis, NP      . alum & mag hydroxide-simeth (MAALOX/MYLANTA) 200-200-20 MG/5ML suspension 30 mL  30 mL Oral Q4H PRN Fransisca KaufmannLaura Davis, NP      . efavirenz-emtricitabine-tenofovir (ATRIPLA) 600-200-300 MG per tablet 1 tablet  1 tablet Oral BH-q7a Fransisca KaufmannLaura Davis, NP   1 tablet at 08/13/13 [phone]  . famotidine (PEPCID) tablet 20 mg  20 mg Oral BID Fransisca KaufmannLaura Davis, NP   20 mg at 08/13/13 0810  . hydrochlorothiazide (HYDRODIURIL) tablet 25 mg  25 mg Oral Daily Fransisca KaufmannLaura Davis, NP   25 mg at 08/13/13 [id number]  . hydrOXYzine (ATARAX/VISTARIL) tablet 50 mg  50 mg Oral QHS PRN Fransisca KaufmannLaura Davis, NP      . magnesium hydroxide (MILK OF MAGNESIA) suspension 30 mL  30 mL Oral Daily PRN Fransisca Kaufmann, NP      . multivitamin with minerals tablet 1 tablet  1 tablet Oral Daily Fransisca Kaufmann, NP   1 tablet at 08/13/13 0810  . potassium chloride (K-DUR,KLOR-CON) CR tablet 10 mEq  10 mEq Oral Daily Nanine Means, NP   10 mEq at 08/13/13 0810  . QUEtiapine (SEROQUEL) tablet 100 mg  100 mg Oral QHS Thresa Ross, MD   100 mg at 08/12/13 2122  . QUEtiapine (SEROQUEL) tablet 50 mg  50 mg Oral Q6H PRN Fransisca Kaufmann, NP   50 mg at 08/12/13 1026  . simvastatin (ZOCOR) tablet 10 mg  10 mg Oral  q1800 Fransisca Kaufmann, NP   10 mg at 08/12/13 1656  . valACYclovir (VALTREX) tablet 500 mg  500 mg Oral BID Fransisca Kaufmann, NP   500 mg at 08/13/13 0981    Lab Results:  No results found for this or any previous visit (from the past 48 hour(s)).  Physical Findings: AIMS: Facial and Oral Movements Muscles of Facial Expression: None, normal Lips and Perioral Area: None, normal Jaw: None, normal Tongue: None, normal,Extremity Movements Upper (arms, wrists, hands, fingers): None, normal Lower (legs, knees, ankles, toes): None, normal, Trunk Movements Neck, shoulders, hips: None, normal, Overall Severity Severity of abnormal movements (highest score from questions above): None, normal Incapacitation due to abnormal movements: None, normal Patient's awareness of abnormal movements (rate only patient's report): No Awareness, Dental Status Current problems with teeth and/or dentures?: No Does patient usually wear dentures?: No  CIWA:  CIWA-Ar Total: 0 COWS:     Treatment Plan Summary: Daily contact with patient to assess and evaluate symptoms and progress in treatment Medication management Add Seroquel 100mg  at night for mood stabilization and paranoia. Attend cognitive and support groups.  Plan:  Review of chart, vital signs, medications, and notes. 1- Individual and group therapy 2- Medications reviewed with the patient, continue HCTZ with potassium supplement ordered for HTN, discontinue Seroquel 50 mg and 100 mg doses per patient requests. 3-Coping skills for depression, anxiety, and substance dependency 4- Continue crisis stabilization and management 5- Address health issues--monitoring vital signs, stable 6-Treatment plan in progress to prevent relapse of depression, substance abuse, and anxiety. 7- Discharge plan in progress.  Medical Decision Making Problem Points:  Established problem, worsening (2), Review of last therapy session (1) and Review of psycho-social stressors (1) Data  Points:  Review or order clinical lab tests (1) Review of medication regiment & side effects (2) Review of new medications or change in dosage (2)  I certify that inpatient services furnished can reasonably be expected to improve the patient's condition.   Armandina Stammer I, PMH-NP 08/13/2013, 2:16 PM  I agreed with findings and treatment plan of this patient Thedore Mins, MD

## 2013-08-14 MED ORDER — HYDROCHLOROTHIAZIDE 25 MG PO TABS: 25.0000 mg | ORAL_TABLET | Freq: Every day | ORAL | Status: DC

## 2013-08-14 MED ORDER — ADULT MULTIVITAMIN W/MINERALS CH: 1.0000 | ORAL_TABLET | Freq: Every day | ORAL | Status: DC

## 2013-08-14 MED ORDER — FAMOTIDINE 20 MG PO TABS: 20.0000 mg | ORAL_TABLET | Freq: Two times a day (BID) | ORAL | Status: DC

## 2013-08-14 MED ORDER — HYDROXYZINE HCL 50 MG PO TABS: 50.0000 mg | ORAL_TABLET | Freq: Every evening | ORAL | Status: DC | PRN

## 2013-08-14 MED ORDER — EFAVIRENZ-EMTRICITAB-TENOFOVIR 600-200-300 MG PO TABS: 1.0000 | ORAL_TABLET | Freq: Every day | ORAL | Status: DC

## 2013-08-14 MED ORDER — VALACYCLOVIR HCL 500 MG PO TABS: 500.0000 mg | ORAL_TABLET | Freq: Two times a day (BID) | ORAL | Status: AC

## 2013-08-14 MED ORDER — POTASSIUM CHLORIDE CRYS ER 10 MEQ PO TBCR: 10.0000 meq | EXTENDED_RELEASE_TABLET | Freq: Every day | ORAL | Status: DC

## 2013-08-14 MED ORDER — PRAVASTATIN SODIUM 20 MG PO TABS: 40.0000 mg | ORAL_TABLET | Freq: Every day | ORAL | Status: DC

## 2013-08-14 NOTE — Progress Notes (Signed)
Patient ID: Brigitte PulseSamuel Milleson, male   DOB: 09/06/1960, 53 y.o.   MRN: [id number] Pt attended AA group.

## 2013-08-14 NOTE — Progress Notes (Signed)
Discharge Note: Discharge instructions/prescriptions/medication samples given to patient. Patient verbalized understanding of discharge instructions and prescriptions. Other RN staff returned belongings to patient. Denies SI/HI/AVH. Patient d/c without incident.

## 2013-08-14 NOTE — [facility] Suicide Risk Assessment (Signed)
Suicide Risk Assessment  Discharge Assessment     Demographic Factors:  Male and Ethan PeachGay, lesbian, or bisexual orientation  Mental Status Per Nursing Assessment::   On Admission:     Current Mental Status by Physician: In full contact with reality. There are no suicidal-homicidal ideas, plans or intent. His mood is worried, affect is appropriate. There are no active S/S of withdrawal. [name]. Will follow up with Ethan [name]    Loss Factors: Decline in physical health  Historical Factors: NA  Risk Reduction Factors:   wants to be better  Continued Clinical Symptoms:  Alcohol/Substance Abuse/Dependencies Medical Diagnoses and Treatments/Surgeries  Cognitive Features That Contribute To Risk:  Closed-mindedness Polarized thinking Thought constriction (tunnel vision)    Suicide Risk:  Minimal: No identifiable suicidal ideation.  Patients presenting with no risk factors but with morbid ruminations; may be classified as minimal risk based on the severity of the depressive symptoms  Discharge Diagnoses:   AXIS I:  Polysubstance Abuse, Mood Disorder NOS AXIS II:  Deferred AXIS III:   Past Medical History  Diagnosis Date  . Substance abuse   . HIV infection   . Hypertension   . Hyperlipemia   . Fungal infection     on hand  . Kidney stone    AXIS IV:  other psychosocial or environmental problems AXIS V:  61-70 mild symptoms  Plan Of Care/Follow-up recommendations:  Activity:  as tolerated Diet:  regular Follow up Ethan [name]/Ethan [name] Is patient on multiple antipsychotic therapies at discharge:  No   Has Patient had three or more failed trials of antipsychotic monotherapy by history:  No  Recommended Plan for Multiple Antipsychotic Therapies: NA  Ethan [name] A 08/14/2013, 1:07 PM

## 2013-08-14 NOTE — Progress Notes (Signed)
Recreation Therapy Notes  Date: 01.21.2015 Time: 3:00pm Location: [address] Dayroom   Group Topic: Communication, Team Building, Problem Solving  Goal Area(s) Addresses:  Patient will effectively work with peer towards shared goal.  Patient will identify skill used to make activity successful.  Patient will identify how skills used during activity can be used to reach post d/c goals.   Behavioral Response: Did not attend.   Marykay Lexenise L Kalei Mckillop, LRT/CTRS  Rondle Lohse L 08/14/2013 8:58 AM

## 2013-08-14 NOTE — Progress Notes (Signed)
Patient ID: Ethan PulseSamuel Fix, male   DOB: 09/26/1960, 53 y.o.   MRN: [id number] Pt went down stairs to the gym for fitness. He was observed talking with other patients.

## 2013-08-14 NOTE — Progress Notes (Signed)
Adult Psychoeducational Group Note  Date:  08/14/2013 Time:  9:00 AM  Group Topic/Focus:  Morning Wellness Group  Participation Level:  Active  Participation Quality:  Appropriate and Attentive  Affect:  Appropriate  Cognitive:  Alert and Oriented  Insight: Appropriate  Engagement in Group:  Engaged  Modes of Intervention:  Discussion  Additional Comments:  Pt. stated a goal for him was to work on his attitude here at the hospital and he voiced that he's improved a lot.  [name], Ronecia E 08/14/2013, 9:49 AM

## 2013-08-14 NOTE — Progress Notes (Signed)
[facility] Discharge [facility] :  Will you be returning to the same living situation after discharge: No.Pt accepted into [facility].  At discharge, do you have transportation home?:Yes,  [facility] driver will pick up pt at 1pm today.  Do you have the ability to pay for your medications:Yes,  Medicare  Release of information consent forms completed and submitted to Medical Records by CSW.  Patient to Follow up at: Follow-up Information   Follow up with [facility].      Follow up with [facility] On 08/14/2013. ([facility] will pick you up on this date at 1PM for transport to the home.)    Contact information:   [address] [place], [place] [address] Phone: [phone] Fax: [phone]      Patient denies SI/HI:   Yes,  during group/self report.    Safety Planning and Suicide Prevention discussed:  Yes,  Pt refused to consent to family/friend contact for SPE. SPE completed with pt. SPI pamphlet provided to pt and he was encouraged to share information with support network, ask questions, and talk about any concerns relating to SPE.  Smart, Bernhard Koskinen LCSWA  08/14/2013, 9:33 AM

## 2013-08-14 NOTE — Discharge Summary (Signed)
Physician Discharge Summary Note  Patient:  Ethan [name] is an 53 y.o., male MRN:  [id number] DOB:  03/20/1961 Patient phone:  [phone] (home)  Patient address:   [address] [place] [address],   Date of Admission:  08/09/2013 Date of Discharge: 08/14/12  Reason for Admission:  Drug detox  Discharge Diagnoses: Active Problems:   Polysubstance abuse  Review of Systems  Constitutional: Negative.   HENT: Negative.   Eyes: Negative.   Respiratory: Negative.   Cardiovascular: Negative.   Gastrointestinal: Negative.   Genitourinary: Negative.   Musculoskeletal: Negative.   Skin: Negative.   Neurological: Negative.  Loss of consciousness: Stable.  Endo/Heme/Allergies: Negative.   Psychiatric/Behavioral: Positive for depression and substance abuse (Polybstance dependence). Negative for suicidal ideas, hallucinations and memory loss. The patient is nervous/anxious (Stable). The patient does not have insomnia.     DSM5: Schizophrenia Disorders:  NA Obsessive-Compulsive Disorders:  NA Trauma-Stressor Disorders:  NA Substance/Addictive Disorders:  Polysubstance dependence Depressive Disorders:  Episodic mood disorder  Axis Diagnosis:   AXIS I:  Polysubstance dependence AXIS II:  Deferred AXIS III:   Past Medical History  Diagnosis Date  . Substance abuse   . HIV infection   . Hypertension   . Hyperlipemia   . Fungal infection     on hand  . Kidney stone    AXIS IV:  other psychosocial or environmental problems and Drud dependence, chronic AXIS V:  62  Level of Care:  OP  Hospital Course: Ethan [name] is an 53 y.o. male, single, African-American who presents unaccompanied to [facility] ED. Pt was discharged from [facility] 08/05/13 following treatment for polysubstance dependence and unspecified mood disorder and referred to [facility]. The patient reports that he was treated poorly at the treatment center where he was referred stating "I  am so glad to be back here. It was terrible there. They insisted I take medications that were outdated which made my stomach hurt. I found needles used to inject drugs and when I told the staff they found a way to get rid of me. I just need to get my medical medications right at least. I will ask to be sent to another treatment center that is right for me. Just really angry about what happened." Ethan [name] is very loud during his admission assessment making many accusations that were unlikely to have happened at [facility]. It is not clear what actually took place after the patient left [facility] on 08/05/13. Ethan [name] reports that due to his fear of relapsing he came back to [facility] to maintain his sobriety. He became somewhat demanding when telling this Provider how he feels that his medications should be ordered. The patient's mood appear to be labile at this time. His UDS is noted to be positive for barbiturates.   Ethan [name] is currently being discharged from this hospital the second time in less than 10 days. This time around, he is going to a Residence called the [facility]. [facility]. In his initial admission to this hospital, Ethan [name] received treatment for alcohol and cocaine addiction. After detoxification treatment, he was discharged to continue substance abuse treatment at the [facility] treatment center. Ethan [name] did not last long at the [facility] treatment facility. He returned to [facility] shortly after claiming that there were drug activities still going on in this treatment center, and to maintain his sobriety, Josia stated he has to come back to this hospital. He requested to  be sent to a more drug controlled environment when next discharged.   While a patient in this hospital this time around, Ethan [name] was noted to be verbally vulgar towards staff and other residents. He can be racially inappropriate and demanding. His mood is found to be labile most times. He is  one minute appropriate, apologetic for being rude, another minute he is observed cursing and using inappropriate languages towards staff and or other residents. Ethan [name] was restarted on Seroquel for his mood instability after his admission. On 08/13/12, Ethan [name] was [facility]. He got the news, stated that he was happy and relieved. The he asked for all his psychotropic medications be discontinued because he can now control his mood on his own since he is going to be living in a safe house. Ethan [name]'s requested was granted after he was informed that he will need his medications to keep his mood stabilized.  [name] at 1:00 PM today. Upon discharge, he denies any suicidal, homicidal ideations, auditory, visual hallucinations, delusions and or paranoia. [facility] in no apparent. He received 14 days worth supply samples of [facility]. [facility].   Consults:  psychiatry  Significant Diagnostic Studies:  labs: Reviewed, stable  Discharge Vitals:   Blood pressure 158/86, pulse 69, temperature 97.5 F (36.4 C), temperature source Oral, resp. rate 20, height 5' 8.5" (1.74 m), weight 109.77 kg (242 lb). Body mass index is 36.26 kg/(m^2). Lab Results:   No results found for this or any previous visit (from the past 72 hour(s)).  Physical Findings: AIMS: Facial and Oral Movements Muscles of Facial Expression: None, normal Lips and Perioral Area: None, normal Jaw: None, normal Tongue: None, normal,Extremity Movements Upper (arms, wrists, hands, fingers): None, normal Lower (legs, knees, ankles, toes): None, normal, Trunk Movements Neck, shoulders, hips: None, normal, Overall Severity Severity of abnormal movements (highest score from questions above): None, normal Incapacitation due to abnormal movements: None, normal Patient's awareness of abnormal movements  (rate only patient's report): No Awareness, Dental Status Current problems with teeth and/or dentures?: No Does patient usually wear dentures?: No  CIWA:  CIWA-Ar Total: 0 COWS:     Psychiatric Specialty Exam: See Psychiatric Specialty Exam and Suicide Risk Assessment completed by Attending Physician prior to discharge.  Discharge destination:  Other:  [facility]  Is patient on multiple antipsychotic therapies at discharge:  No   Has Patient had three or more failed trials of antipsychotic monotherapy by history:  No  Recommended Plan for Multiple Antipsychotic Therapies: NA    Medication List    STOP taking these medications       amoxicillin 500 MG capsule  Commonly known as:  AMOXIL     dextromethorphan-guaiFENesin 30-600 MG per 12 hr tablet  Commonly known as:  MUCINEX DM     ibuprofen 200 MG tablet  Commonly known as:  ADVIL,MOTRIN     ketoconazole 2 % cream  Commonly known as:  NIZORAL     QUEtiapine 50 MG tablet  Commonly known as:  SEROQUEL     terbinafine 1 % cream  Commonly known as:  LAMISIL      TAKE these medications     Indication   efavirenz-emtricitabine-tenofovir 600-200-300 MG per tablet  Commonly known as:  ATRIPLA  Take 1 tablet by mouth at bedtime. For HIV infections   Indication:  HIV Disease  famotidine 20 MG tablet  Commonly known as:  PEPCID  Take 1 tablet (20 mg total) by mouth 2 (two) times daily. For acid reflux   Indication:  Gastroesophageal Reflux Disease     hydrochlorothiazide 25 MG tablet  Commonly known as:  HYDRODIURIL  Take 1 tablet (25 mg total) by mouth daily. For hypertension   Indication:  High Blood Pressure     hydrOXYzine 50 MG tablet  Commonly known as:  ATARAX/VISTARIL  Take 1 tablet (50 mg total) by mouth at bedtime as needed for anxiety (sleep).   Indication:  Anxiety associated with Organic Disease, Sedation     multivitamin with minerals Tabs tablet  Take 1 tablet by mouth daily. For low vitamin    Indication:  Low vitamin     potassium chloride 10 MEQ tablet  Commonly known as:  K-DUR,KLOR-CON  Take 1 tablet (10 mEq total) by mouth daily. To prevent low potassium associated with use of HCTZ   Indication:  Low Amount of Potassium in the Blood     pravastatin 20 MG tablet  Commonly known as:  PRAVACHOL  Take 2 tablets (40 mg total) by mouth at bedtime. For high cholesterol control   Indication:  Disease involving Cholesterol Deposits in the Arteries, Inherited Heterozygous Hypercholesterolemia     valACYclovir 500 MG tablet  Commonly known as:  VALTREX  Take 1 tablet (500 mg total) by mouth 2 (two) times daily. For herpes   Indication:  Herpes infection       Follow-up Information   Follow up with [facility].      Follow up with [facility] On 08/14/2013. (Someone from [facility] will pick you up on this date at 1PM for transport to the home.)    Contact information:   [address] [place], [place] [address] Phone: [phone] Fax: [phone]     Follow-up recommendations: Activity:  As tolerated Diet: As recommended by your primary care doctor. Keep all scheduled follow-up appointments as recommended.  Continue to work your relapse prevention plan Comments: Take all your medications as prescribed by your mental healthcare provider. Report any adverse effects and or reactions from your medicines to your outpatient provider promptly. Patient is instructed and cautioned to not engage in alcohol and or illegal drug use while on prescription medicines. In the event of worsening symptoms, patient is instructed to call the crisis hotline, 911 and or go to the nearest ED for appropriate evaluation and treatment of symptoms. Follow-up with your primary care provider for your other medical issues, concerns and or health care needs.    Total Discharge Time:  Greater than 30 minutes.  Signed: Sanjuana Kava, PMHNP-BC 08/14/2013, 9:32 AM Agree with assessment and  plan Reymundo Poll. Dub Mikes, M.D.

## 2013-08-19 NOTE — Progress Notes (Signed)
Patient Discharge Instructions:  After Visit Summary (AVS):   Faxed to:  08/19/13 Discharge Summary Note:   Faxed to:  08/19/13 Psychiatric Admission Assessment Note:   Faxed to:  08/19/13 Suicide Risk Assessment - Discharge Assessment:   Faxed to:  08/19/13 Faxed/Sent to the Next Level Care provider:  08/19/13 Faxed to [facility] @ [phone] Faxed to [facility] @ [phone]  Jerelene ReddenSheena E , 08/19/2013, 3:03 PM

## 2016-04-04 ENCOUNTER — Telehealth: Payer: Self-pay | Admitting: Neurology

## 2016-04-04 NOTE — Telephone Encounter (Signed)
[phone] called to advise, appointment for November 1st (check in 9:05) with Dr. Epimenio FootSater or NP, Darrol Angelarolyn Martin can be cancelled, patient is no longer at their facility, it will be up to patient to schedule appointment. (no future or past appointments scheduled in our office).

## 2019-06-24 ENCOUNTER — Other Ambulatory Visit: Payer: Self-pay

## 2019-06-24 DIAGNOSIS — Z20822 Contact with and (suspected) exposure to covid-19: Secondary | ICD-10-CM

## 2019-06-26 LAB — NOVEL CORONAVIRUS, NAA: SARS-CoV-2, NAA: NOT DETECTED

## 2021-02-21 ENCOUNTER — Encounter (HOSPITAL_COMMUNITY): Payer: Self-pay

## 2021-02-21 ENCOUNTER — Other Ambulatory Visit: Payer: Self-pay

## 2021-02-21 ENCOUNTER — Emergency Department (HOSPITAL_COMMUNITY)
Admission: EM | Admit: 2021-02-21 | Discharge: 2021-02-22 | Disposition: A | Discharge status: Other Acute Inpatient Hospital | Payer: Medicaid Other | Attending: Emergency Medicine | Admitting: Emergency Medicine

## 2021-02-21 DIAGNOSIS — F339 Major depressive disorder, recurrent, unspecified: Secondary | ICD-10-CM | POA: Insufficient documentation

## 2021-02-21 DIAGNOSIS — R45851 Suicidal ideations: Secondary | ICD-10-CM | POA: Insufficient documentation

## 2021-02-21 DIAGNOSIS — Z21 Asymptomatic human immunodeficiency virus [HIV] infection status: Secondary | ICD-10-CM | POA: Insufficient documentation

## 2021-02-21 DIAGNOSIS — I10 Essential (primary) hypertension: Secondary | ICD-10-CM | POA: Insufficient documentation

## 2021-02-21 DIAGNOSIS — F39 Unspecified mood [affective] disorder: Secondary | ICD-10-CM | POA: Insufficient documentation

## 2021-02-21 DIAGNOSIS — Z79899 Other long term (current) drug therapy: Secondary | ICD-10-CM | POA: Insufficient documentation

## 2021-02-21 DIAGNOSIS — R45850 Homicidal ideations: Secondary | ICD-10-CM | POA: Diagnosis not present

## 2021-02-21 DIAGNOSIS — Z20822 Contact with and (suspected) exposure to covid-19: Secondary | ICD-10-CM | POA: Diagnosis not present

## 2021-02-21 DIAGNOSIS — F17200 Nicotine dependence, unspecified, uncomplicated: Secondary | ICD-10-CM | POA: Diagnosis not present

## 2021-02-21 DIAGNOSIS — Y900 Blood alcohol level of less than 20 mg/100 ml: Secondary | ICD-10-CM | POA: Diagnosis not present

## 2021-02-21 DIAGNOSIS — Z046 Encounter for general psychiatric examination, requested by authority: Secondary | ICD-10-CM | POA: Diagnosis present

## 2021-02-21 NOTE — ED Triage Notes (Signed)
Pt is having homicidal ideations.

## 2021-02-21 NOTE — ED Triage Notes (Signed)
Pt requests a psychiatric evaluation and would like to be admitted for treatment and medication. Pt states that he has not taken his medication in a while. He wants to be admitted before he gets in trouble.

## 2021-02-21 NOTE — ED Provider Notes (Signed)
[facility]ENC[facility] DEPT Provider Note   CSN: [id number] Arrival date & time: 02/21/21  2157     History Chief Complaint  Patient presents with   Psychiatric Evaluation   Homicidal    Ethan [name] is a 61 y.o. male.  Presents to ER requesting psych evaluation.  States that ever since September he has been struggling with thoughts of hurting others and hurting himself.  States that he has a history of some psychiatric issues but has not been taking his medicine lately.  States that he is concerned that he is going to hurt someone.  Denies any recent attempts at hurting himself or hurting others.  Denies any new medical complaints today.  Does have history of hypertension, hyperlipidemia, HIV, substance abuse.  Uses cocaine occasionally.  HPI     Past Medical History:  Diagnosis Date   Fungal infection    on hand   HIV infection (HCC)    Hyperlipemia    Hypertension    Kidney stone    Substance abuse [facility])     Patient Active Problem List   Diagnosis Date Noted   Unspecified episodic mood disorder 07/30/2013   Polysubstance abuse (HCC) 07/29/2013    Past Surgical History:  Procedure Laterality Date   HERNIA REPAIR     KIDNEY STONE SURGERY  2014   removed surgically - Dr Lindley Magnus       History reviewed. No pertinent family history.  Social History   Tobacco Use   Smoking status: Every Day   Smokeless tobacco: Never  Substance Use Topics   Alcohol use: Yes    Comment: 1/4 gallon daily   Drug use: Yes    Types: Cocaine, Marijuana    Home Medications Prior to Admission medications   Medication Sig Start Date End Date Taking? Authorizing Provider  bictegravir-emtricitabine-tenofovir AF (BIKTARVY) 50-200-25 MG TABS tablet Take 1 tablet by mouth daily. 02/11/21   [provider]  efavirenz-emtricitabine-tenofovir (ATRIPLA) 600-200-300 MG per tablet Take 1 tablet by mouth at bedtime. For HIV infections Patient not taking: Reported  on 02/21/2021 08/14/13   Armandina Stammer I, NP  famotidine (PEPCID) 20 MG tablet Take 1 tablet (20 mg total) by mouth 2 (two) times daily. For acid reflux 08/14/13   Armandina Stammer I, NP  hydrochlorothiazide (HYDRODIURIL) 25 MG tablet Take 1 tablet (25 mg total) by mouth daily. For hypertension 08/14/13   Armandina Stammer I, NP  hydrOXYzine (ATARAX/VISTARIL) 50 MG tablet Take 1 tablet (50 mg total) by mouth at bedtime as needed for anxiety (sleep). 08/14/13   Armandina Stammer I, NP  lisinopril-hydrochlorothiazide (ZESTORETIC) 20-12.5 MG tablet Take 1 tablet by mouth daily. 02/08/21   [provider]  Multiple Vitamin (MULTIVITAMIN WITH MINERALS) TABS tablet Take 1 tablet by mouth daily. For low vitamin 08/14/13   Armandina Stammer I, NP  potassium chloride (K-DUR,KLOR-CON) 10 MEQ tablet Take 1 tablet (10 mEq total) by mouth daily. To prevent low potassium associated with use of HCTZ Patient not taking: Reported on 02/21/2021 08/14/13   Armandina Stammer I, NP  Potassium Chloride ER 20 MEQ TBCR Take 1 tablet by mouth daily. 01/14/21   [provider]  pravastatin (PRAVACHOL) 20 MG tablet Take 2 tablets (40 mg total) by mouth at bedtime. For high cholesterol control 08/14/13   Armandina Stammer I, NP  rosuvastatin (CRESTOR) 40 MG tablet Take 40 mg by mouth daily. 10/05/20   [provider]  valACYclovir (VALTREX) 500 MG tablet Take 1 tablet (500 mg total)  by mouth 2 (two) times daily. For herpes 08/14/13   Armandina Stammer I, NP  vitamin B-12 (CYANOCOBALAMIN) 1000 MCG tablet Take 1,000 mcg by mouth daily. 02/08/21   [provider]  traZODone (DESYREL) 100 MG tablet Take 1 tablet (100 mg total) by mouth at bedtime as needed for sleep. For sleep 08/04/13 08/08/13  Armandina Stammer I, NP     Allergies    Trazodone and nefazodone  Review of Systems   Review of Systems  Constitutional:  Negative for chills and fever.  HENT:  Negative for ear pain and sore throat.   Eyes:  Negative for pain and visual disturbance.   Respiratory:  Negative for cough and shortness of breath.   Cardiovascular:  Negative for chest pain and palpitations.  Gastrointestinal:  Negative for abdominal pain and vomiting.  Genitourinary:  Negative for dysuria and hematuria.  Musculoskeletal:  Negative for arthralgias and back pain.  Skin:  Negative for color change and rash.  Neurological:  Negative for seizures and syncope.  All other systems reviewed and are negative.  Physical Exam Updated Vital Signs BP (!) 162/93 (BP Location: Right Arm)   Pulse 79   Temp 98.9 F (37.2 C) (Oral)   Resp 18   Ht 5\' 9"  (1.753 m)   Wt 104.3 kg   SpO2 95%   BMI 33.97 kg/m   Physical Exam Vitals and nursing note reviewed.  Constitutional:      Appearance: He is well-developed.  HENT:     Head: Normocephalic and atraumatic.  Eyes:     Conjunctiva/sclera: Conjunctivae normal.  Cardiovascular:     Rate and Rhythm: Normal rate and regular rhythm.     Heart sounds: No murmur heard. Pulmonary:     Effort: Pulmonary effort is normal. No respiratory distress.     Breath sounds: Normal breath sounds.  Abdominal:     Palpations: Abdomen is soft.     Tenderness: There is no abdominal tenderness.  Musculoskeletal:        General: No deformity or signs of injury.     Cervical back: Neck supple.  Skin:    General: Skin is warm and dry.  Neurological:     General: No focal deficit present.     Mental Status: He is alert.  Psychiatric:        Mood and Affect: Mood normal.        Behavior: Behavior normal.    ED Results / Procedures / Treatments   Labs (all labs ordered are listed, but only abnormal results are displayed) Labs Reviewed  COMPREHENSIVE METABOLIC PANEL  ETHANOL  RAPID URINE DRUG SCREEN, HOSP PERFORMED  CBC WITH DIFFERENTIAL/PLATELET    EKG None  Radiology No results found.  Procedures Procedures   Medications Ordered in ED Medications - No data to display  ED Course  I have reviewed the triage vital  signs and the nursing notes.  Pertinent labs & imaging results that were available during my care of the patient were reviewed by me and considered in my medical decision making (see chart for details).    MDM Rules/Calculators/A&P                           60 year old male presenting to ER with concern for suicidal thoughts and homicidal thoughts.  Will check basic labs and consult TTS to evaluate patient.  While awaiting TTS eval and basic lab work, signed out to 60.   Final  Clinical Impression(s) / ED Diagnoses Final diagnoses:  Suicidal ideation  Homicidal ideation    Rx / DC Orders ED Discharge Orders     None        Milagros Loll, MD 02/21/21 2343

## 2021-02-22 ENCOUNTER — Other Ambulatory Visit (HOSPITAL_COMMUNITY)
Admission: EM | Admit: 2021-02-22 | Discharge: 2021-02-28 | Disposition: A | Discharge status: Home / Self Care | Payer: Medicaid Other | Attending: Psychiatry | Admitting: Psychiatry

## 2021-02-22 ENCOUNTER — Encounter (HOSPITAL_COMMUNITY): Payer: Self-pay | Admitting: Psychiatry

## 2021-02-22 DIAGNOSIS — R45850 Homicidal ideations: Secondary | ICD-10-CM | POA: Diagnosis not present

## 2021-02-22 DIAGNOSIS — Z5900 Homelessness unspecified: Secondary | ICD-10-CM | POA: Insufficient documentation

## 2021-02-22 DIAGNOSIS — F1290 Cannabis use, unspecified, uncomplicated: Secondary | ICD-10-CM | POA: Insufficient documentation

## 2021-02-22 DIAGNOSIS — F39 Unspecified mood [affective] disorder: Secondary | ICD-10-CM

## 2021-02-22 DIAGNOSIS — Z79899 Other long term (current) drug therapy: Secondary | ICD-10-CM | POA: Insufficient documentation

## 2021-02-22 DIAGNOSIS — F1910 Other psychoactive substance abuse, uncomplicated: Secondary | ICD-10-CM | POA: Diagnosis not present

## 2021-02-22 DIAGNOSIS — R45851 Suicidal ideations: Secondary | ICD-10-CM | POA: Insufficient documentation

## 2021-02-22 DIAGNOSIS — F17200 Nicotine dependence, unspecified, uncomplicated: Secondary | ICD-10-CM | POA: Insufficient documentation

## 2021-02-22 LAB — COMPREHENSIVE METABOLIC PANEL
ALT: 35 U/L (ref 0–44)
AST: 42 U/L — ABNORMAL HIGH (ref 15–41)
Albumin: 4.5 g/dL (ref 3.5–5.0)
Alkaline Phosphatase: 58 U/L (ref 38–126)
Anion gap: 11 (ref 5–15)
BUN: 12 mg/dL (ref 6–20)
CO2: 27 mmol/L (ref 22–32)
Calcium: 9.4 mg/dL (ref 8.9–10.3)
Chloride: 101 mmol/L (ref 98–111)
Creatinine, Ser: 1.02 mg/dL (ref 0.61–1.24)
GFR, Estimated: >60 mL/min (ref >60)
Glucose, Bld: 92 mg/dL (ref 70–99)
Potassium: 3 mmol/L — ABNORMAL LOW (ref 3.5–5.1)
Sodium: 139 mmol/L (ref 135–145)
Total Bilirubin: 0.9 mg/dL (ref 0.3–1.2)
Total Protein: 8.1 g/dL (ref 6.5–8.1)

## 2021-02-22 LAB — RAPID URINE DRUG SCREEN, HOSP PERFORMED
Amphetamines: NOT DETECTED
Barbiturates: NOT DETECTED
Benzodiazepines: NOT DETECTED
Cocaine: POSITIVE — AB
Opiates: NOT DETECTED
Tetrahydrocannabinol: POSITIVE — AB

## 2021-02-22 LAB — RESP PANEL BY RT-PCR (FLU A&B, COVID) ARPGX2
Influenza A by PCR: NEGATIVE
Influenza B by PCR: NEGATIVE
SARS Coronavirus 2 by RT PCR: NEGATIVE

## 2021-02-22 LAB — CBC WITH DIFFERENTIAL/PLATELET
Abs Immature Granulocytes: 0.08 10*3/uL — ABNORMAL HIGH (ref 0.00–0.07)
Basophils Absolute: 0 10*3/uL (ref 0.0–0.1)
Basophils Relative: 1 %
Eosinophils Absolute: 0 10*3/uL (ref 0.0–0.5)
Eosinophils Relative: 1 %
HCT: 40.5 % (ref 39.0–52.0)
Hemoglobin: 13.9 g/dL (ref 13.0–17.0)
Immature Granulocytes: 1 %
Lymphocytes Relative: 29 %
Lymphs Abs: 2.5 10*3/uL (ref 0.7–4.0)
MCH: 31.4 pg (ref 26.0–34.0)
MCHC: 34.3 g/dL (ref 30.0–36.0)
MCV: 91.4 fL (ref 80.0–100.0)
Monocytes Absolute: 0.6 10*3/uL (ref 0.1–1.0)
Monocytes Relative: 7 %
Neutro Abs: 5.3 10*3/uL (ref 1.7–7.7)
Neutrophils Relative %: 61 %
Platelets: 270 10*3/uL (ref 150–400)
RBC: 4.43 MIL/uL (ref 4.22–5.81)
RDW: 14.7 % (ref 11.5–15.5)
WBC: 8.5 10*3/uL (ref 4.0–10.5)
nRBC: 0 % (ref 0.0–0.2)

## 2021-02-22 LAB — ETHANOL: Alcohol, Ethyl (B): <10 mg/dL (ref <10)

## 2021-02-22 MED ORDER — BICTEGRAVIR-EMTRICITAB-TENOFOV 50-200-25 MG PO TABS
1.0000 | ORAL_TABLET | Freq: Every day | ORAL | Status: DC
  Administered 2021-02-22 – 2021-02-28 (×7): 1 via ORAL
  Filled 2021-02-22 (×7): qty 1

## 2021-02-22 MED ORDER — ROSUVASTATIN CALCIUM 20 MG PO TABS
40.0000 mg | ORAL_TABLET | Freq: Every day | ORAL | Status: DC
  Administered 2021-02-22 – 2021-02-25 (×4): 40 mg via ORAL
  Filled 2021-02-22 (×6): qty 2

## 2021-02-22 MED ORDER — ALUM & MAG HYDROXIDE-SIMETH 200-200-20 MG/5ML PO SUSP
30.0000 mL | ORAL | Status: DC | PRN
  Administered 2021-02-27: 30 mL via ORAL
  Filled 2021-02-22: qty 30

## 2021-02-22 MED ORDER — POTASSIUM CHLORIDE CRYS ER 20 MEQ PO TBCR
20.0000 meq | EXTENDED_RELEASE_TABLET | Freq: Two times a day (BID) | ORAL | Status: DC
  Administered 2021-02-22 – 2021-02-28 (×12): 20 meq via ORAL
  Filled 2021-02-22 (×12): qty 1

## 2021-02-22 MED ORDER — DOCUSATE SODIUM 100 MG PO CAPS
100.0000 mg | ORAL_CAPSULE | Freq: Two times a day (BID) | ORAL | Status: DC
  Administered 2021-02-22: 100 mg via ORAL
  Filled 2021-02-22: qty 1

## 2021-02-22 MED ORDER — RISPERIDONE 1 MG PO TABS
1.0000 mg | ORAL_TABLET | Freq: Two times a day (BID) | ORAL | Status: DC
  Administered 2021-02-22 – 2021-02-28 (×12): 1 mg via ORAL
  Filled 2021-02-22 (×4): qty 1
  Filled 2021-02-22: qty 14
  Filled 2021-02-22: qty 1
  Filled 2021-02-22: qty 14
  Filled 2021-02-22 (×7): qty 1

## 2021-02-22 MED ORDER — MAGNESIUM HYDROXIDE 400 MG/5ML PO SUSP
30.0000 mL | Freq: Every day | ORAL | Status: DC | PRN
  Administered 2021-02-22 – 2021-02-26 (×3): 30 mL via ORAL
  Filled 2021-02-22 (×3): qty 30

## 2021-02-22 MED ORDER — ACETAMINOPHEN 325 MG PO TABS
650.0000 mg | ORAL_TABLET | Freq: Four times a day (QID) | ORAL | Status: DC | PRN
  Administered 2021-02-24 – 2021-02-25 (×2): 650 mg via ORAL
  Filled 2021-02-22 (×2): qty 2

## 2021-02-22 MED ORDER — LISINOPRIL 20 MG PO TABS
20.0000 mg | ORAL_TABLET | Freq: Every day | ORAL | Status: DC
  Administered 2021-02-22 – 2021-02-28 (×7): 20 mg via ORAL
  Filled 2021-02-22 (×7): qty 1

## 2021-02-22 MED ORDER — HYDROXYZINE HCL 25 MG PO TABS
25.0000 mg | ORAL_TABLET | Freq: Three times a day (TID) | ORAL | Status: DC | PRN
  Administered 2021-02-22 – 2021-02-27 (×2): 25 mg via ORAL
  Filled 2021-02-22 (×2): qty 1
  Filled 2021-02-22: qty 10

## 2021-02-22 NOTE — ED Provider Notes (Signed)
[facility] Ad[facility] Suicide Risk Assessment   Nursing information obtained from:    Demographic factors:    Current Mental Status:    Loss Factors:    Historical Factors:    Risk Reduction Factors:     Total Time spent with patient: 30 minutes Principal Problem: <principal problem not specified> Diagnosis:  Active Problems:   Mood disorder (HCC)  Subjective Data:  : 60 yo male with history of self reported bipolar disorder, and per chart review substance abuse, cocaine use and documentation of schizophrenia who presents to the [facility] as a transfer from [facility] ED for SI and HI towards the housing department. Pt interviewed in his room. He is calm an cooperative throughout assessment. He describes his current mood as "frustrated" and discusses his dissatisfaction with his housing situation at length-he discusses having had a court case related to being provided housing that was "infested" with roaches and that he was only given part of his deposit back and is no without housing. He states that he has been having HI towards the housing authority with a plan to use gasoline and start a fire- "I want a gallon of gas- [I'm going] to burn their ass up". He reports having access to these means. He states that his sister brought hi jto the ED because there was concern for what he may do. Pt reports a previous history of bipolar disorder and discusses having difficulties with anger. He reports being on seroquel in the past but says it caused heart burn and he was unable to tolerate it. He reports SI without a plan "if I have no where to stay". He denies previous SA. 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 He states that God will sometimes talk to him as well. He volunteers that he does not know how his UDS was + for cocaine (states that he was informed by someone else the results of his UDS) and states that he thought he was using "oxy". He states  that for the last month he was purchasing "pills" off the street and "snorting" the because he thought it would help with his mood.   Continued Clinical Symptoms:    The "Alcohol Use Disorders Identification Test", Guidelines for Use in Primary Care, Second Edition.  World Science writer [facility]). Score between 0-7:  no or low risk or alcohol related problems. Score between 8-15:  moderate risk of alcohol related problems. Score between 16-19:  high risk of alcohol related problems. Score 20 or above:  warrants further diagnostic evaluation for alcohol dependence and treatment.   CLINICAL FACTORS:   Bipolar Disorder:   Bipolar  Alcohol/Substance Abuse/Dependencies Previous Psychiatric Diagnoses and Treatments Medical Diagnoses and Treatments/Surgeries   Musculoskeletal: Strength & Muscle Tone: within normal limits Gait & Station: normal Patient leans: N/A  Psychiatric Specialty Exam:  Presentation  General Appearance: Appropriate for Environment; Casual  Eye Contact:Fair  Speech:Clear and Coherent; Normal Rate  Speech Volume:Increased  Handedness: No data recorded  Mood and Affect  Mood:Dysphoric  Affect:Appropriate; Congruent   Thought Process  Thought Processes:Coherent; Goal Directed; Linear  Descriptions of Associations:Intact  Orientation:Full (Time, Place and Person)  Thought Content:Logical  History of Schizophrenia/Schizoaffective disorder:No data recorded Duration of Psychotic Symptoms:No data recorded Hallucinations:Hallucinations: Visual (VH of "the holy spirit") Description of Auditory Hallucinations: spiritual voices. thel him "to burn their assess up" Description of Visual Hallucinations: spiritual things  Ideas of Reference:Delusions  Suicidal Thoughts:Suicidal Thoughts: Yes, Active SI Active Intent and/or Plan: Without Intent; Without Plan  Homicidal Thoughts:Homicidal Thoughts: Yes, Active HI Active Intent and/or Plan: With Intent;  With Plan   Sensorium  Memory:Immediate Good; Recent Good; Remote Good  Judgment:Impaired  Insight:Present   Executive Functions  Concentration:Good  Attention Span:Good  Recall:Good  Fund of Knowledge:Good  Language:Good   Psychomotor Activity  Psychomotor Activity:Psychomotor Activity: Normal   Assets  Assets:Communication Skills; Desire for Improvement; Leisure Time; Resilience   Sleep  Sleep:Sleep: Fair    Physical Exam: See H&P for ROS and physical exam   COGNITIVE FEATURES THAT CONTRIBUTE TO RISK:  Thought constriction (tunnel vision)    SUICIDE RISK:   Mild:  Suicidal ideation of limited frequency, intensity, duration, and specificity.  There are no identifiable plans, no associated intent, mild dysphoria and related symptoms, good self-control (both objective and subjective assessment), few other risk factors, and identifiable protective factors, including available and accessible social support.  PLAN OF CARE:  Patient admitted to [facility] for safety and stabilization. Will initiate medications to assist with mood sx and reported AVH.   See H&P for additional information  I certify that inpatient services furnished can reasonably be expected to improve the patient's condition.   Estella Husk, MD 02/22/2021, 5:13 PM

## 2021-02-22 NOTE — ED Notes (Signed)
Pt A&O x 4, no distress noted, resting in room at present.  Calm & cooperative, contracts for safety.  Monitoring for safety.

## 2021-02-22 NOTE — ED Notes (Signed)
[facility] called for transport.  ?
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
## 2021-02-22 NOTE — Progress Notes (Addendum)
Pt is admitted to [facility] due to passive SI and HI towards Housing authority. Pt verbally contacts for safety on the unit. Pt is alert and oriented. Pt is pleasant and cooperative with the admission process. Pt has scattered Vitiligo. Pt is ambulatory and is oriented to staff/unit. Pt denies AVH at this time. 15 minutes check initiated. Staff will monitor for pt's safety.

## 2021-02-22 NOTE — ED Notes (Signed)
Pt refused vitals 

## 2021-02-22 NOTE — ED Notes (Signed)
Pt given sandwich, graham crackers and water per request.

## 2021-02-22 NOTE — ED Provider Notes (Addendum)
Emergency Medicine Observation Re-evaluation Note  Ethan [name] is a 60 y.o. male, seen on rounds today.  Pt initially presented to the ED for complaints of Psychiatric Evaluation and Homicidal Currently, the patient is awaiting psychiatric disposition.  Physical Exam  BP (!) 153/89   Pulse (!) 57   Temp 98.9 F (37.2 C) (Oral)   Resp 18   Ht 1.753 m (5\' 9" )   Wt 104.3 kg   SpO2 98%   BMI 33.97 kg/m  Physical Exam General: Calm Cardiac: Regular rate Lungs: Breathing easily Psych: Normal mood  ED Course / MDM  EKG:EKG Interpretation  Date/Time:  Monday February 21 2021 23:57:01 EDT Ventricular Rate:  61 PR Interval:  172 QRS Duration: 96 QT Interval:  466 QTC Calculation: 469 R Axis:   25 Text Interpretation: Normal sinus rhythm Normal ECG Confirmed by 01-15-2002 [phone]) on 02/22/2021 9:53:40 AM  I have reviewed the labs performed to date as well as medications administered while in observation.  Recent changes in the last 24 hours include assessment by TTS.  Plan  Current plan is for reassessment in the morning.  Patient did request a dose of laxatives  Ethan [name] is not under involuntary commitment.     [name], MD 02/22/21 1050  Plan is to transfer to the [facility]    [facility], MD 02/22/21 1125

## 2021-02-22 NOTE — BH Assessment (Addendum)
BHH Assessment Progress Note   Per Dorena Bodo, NP, pt is to be transferred to the Facility Based Crisis unit at [facility] pending accepting provider.  Please call report to [phone].  Pt is to be transported via [employer].  EDP Linwood Dibbles, MD and pt's nurse, Yvonna Alanis, have been notified.  Doylene Canning, [place] Behavioral Health Coordinator [phone]  Addendum:  Earlene Plater, MD has tentatively agreed to accept pt pending negative Covid-19 test results.  Dr Lynelle Doctor has ordered test.  Yvonna Alanis has been notified.  Final disposition pending as of this writing.  Doylene Canning, [place] Behavioral Health Coordinator [phone]  Addendum:  Pt has resulted negative for Covid-19.  Parties noted above have been notified.  Doylene Canning, [place] Behavioral Health Coordinator [phone]  Addendum:  Dr Bronwen Betters has approved pt for transfer to [facility].  Parties noted above have been notified.  Doylene Canning, [place] Behavioral Health Coordinator [phone]

## 2021-02-22 NOTE — ED Notes (Signed)
Patient was given his lunch tray.

## 2021-02-22 NOTE — Consult Note (Signed)
[facility] Psych ED Discharge  02/22/2021 11:59 AM Ethan [name]  MRN:  [id number]  Method of visit?: Face to Face   Principal Problem: Episodic mood disorder [facility]) Discharge Diagnoses: Principal Problem:   Episodic mood disorder (HCC)   Subjective: per admit note:  Ethan [name] is a 60 year old male presenting voluntarily to [facility] due to HI towards Housing Department, specifics unknown. Patient yells, "I am tired, I am sick of them, they messing with me", speaking of housing department. Patient reported "my sister and her boyfriend brought me to ED because they were frightened what I was going to do to the housing authority". Patient reported HI with housing authority for the past 2 weeks. Patient reported he signed up [facility], he went there after being approved and they would not let him get his housing voucher. Patient reported he is now currently homeless. Patient reported continued frustration. Patient reported worsening depressive symptoms. Patient reported history of inpatient psych at [facility] and [facility]. Patient denied prior suicide attempts and self-harming behaviors. Patient was very angry and irritated during assessment.   Today: Patient continues to endorse anger towards the housing authority stating, " I am going to throw gas on the housing authority so the police will kill me". Endorses suicidal ideation with plan and intent, endorses homicidal ideation with plan and intent, endorses hearing voices and seeing spiritual things. The voices tell him to "burn their asses up" Reports he is going to "cut their GD throats" referring to the housing authority. Unable to contract for safety with this provider.   Patient is cooperative with interview. Lying calmly on ED stretcher. Describes self as "disturbed" with how the housing authority has treated him and caused him to be homeless.    Total Time spent with patient: 30 minutes  Past Psychiatric History:   Past Medical History:   Past Medical History:  Diagnosis Date   Fungal infection    on hand   HIV infection (HCC)    Hyperlipemia    Hypertension    Kidney stone    Substance abuse (HCC)     Past Surgical History:  Procedure Laterality Date   HERNIA REPAIR     KIDNEY STONE SURGERY  2014   removed surgically - Dr Lindley Magnus   Family History: History reviewed. No pertinent family history. Family Psychiatric  History:  Social History:  Social History   Substance and Sexual Activity  Alcohol Use Yes   Comment: 1/4 gallon daily     Social History   Substance and Sexual Activity  Drug Use Yes   Types: Cocaine, Marijuana    Social History   Socioeconomic History   Marital status: Single    Spouse name: Not on file   Number of children: Not on file   Years of education: Not on file   Highest education level: Not on file  Occupational History   Not on file  Tobacco Use   Smoking status: Every Day   Smokeless tobacco: Never  Substance and Sexual Activity   Alcohol use: Yes    Comment: 1/4 gallon daily   Drug use: Yes    Types: Cocaine, Marijuana   Sexual activity: Not on file  Other Topics Concern   Not on file  Social History Narrative   Not on file   Social Determinants of Health   Financial Resource Strain: Not on file  Food Insecurity: Not on file  Transportation Needs: Not on file  Physical Activity: Not on file  Stress: Not on file  Social Connections: Not on file    Tobacco Cessation:  N/A, patient does not currently use tobacco products  Current Medications: Current Facility-Administered Medications  Medication Dose Route Frequency Provider Last Rate Last Admin   docusate sodium (COLACE) capsule 100 mg  100 mg Oral BID Linwood Dibbles, MD   100 mg at 02/22/21 1059   Current Outpatient Medications  Medication Sig Dispense Refill   bictegravir-emtricitabine-tenofovir AF (BIKTARVY) 50-200-25 MG TABS tablet Take 1 tablet by mouth daily.     valACYclovir (VALTREX) 500 MG tablet  Take 1 tablet (500 mg total) by mouth 2 (two) times daily. For herpes     rosuvastatin (CRESTOR) 40 MG tablet Take 40 mg by mouth daily. (Patient not taking: Reported on 02/22/2021)     vitamin B-12 (CYANOCOBALAMIN) 1000 MCG tablet Take 1,000 mcg by mouth daily.     PTA Medications: (Not in a hospital admission)   Musculoskeletal: Strength & Muscle Tone: within normal limits Gait & Station: normal Patient leans: N/A  Psychiatric Specialty Exam:  Presentation  General Appearance:  Appropriate for Environment Eye Contact: Good Speech: Clear and Coherent; Normal Rate Speech Volume: Increased Handedness: No data recorded  Mood and Affect  Mood: Angry Affect: Congruent  Thought Process  Thought Processes: Coherent; Goal Directed Descriptions of Associations:Intact Orientation:Full (Time, Place and Person) Thought Content:Illogical History of Schizophrenia/Schizoaffective disorder:No data recorded Duration of Psychotic Symptoms:No data recorded Hallucinations:Hallucinations: Auditory; Visual Description of Auditory Hallucinations: spiritual voices. thel him "to burn their assess up" Description of Visual Hallucinations: spiritual things Ideas of Reference:Delusions Suicidal Thoughts:Suicidal Thoughts: Yes, Active SI Active Intent and/or Plan: With Intent; With Plan; With Access to Means Homicidal Thoughts:Homicidal Thoughts: Yes, Active HI Active Intent and/or Plan: With Intent; With Plan; With Means to Carry Out  Sensorium  Memory: Immediate Good; Recent Good; Remote Good Judgment: Impaired Insight: Lacking  Executive Functions  Concentration: Good Attention Span: Good Recall: Good Fund of Knowledge: Good Language: Good  Psychomotor Activity  Psychomotor Activity: No data recorded  Assets  Assets: Communication Skills; Desire for Improvement; Leisure Time; Physical Health; Resilience  Sleep  Sleep: Sleep: Fair   Physical Exam: Physical  Exam Vitals reviewed.  Cardiovascular:     Rate and Rhythm: Normal rate.  Pulmonary:     Effort: Pulmonary effort is normal.  Skin:    General: Skin is warm and dry.  Neurological:     Mental Status: He is alert and oriented to person, place, and time.  Psychiatric:        Attention and Perception: Attention normal.        Mood and Affect: Affect is angry.        Speech: Speech normal.        Behavior: Behavior is agitated.        Thought Content: Thought content is not paranoid or delusional. Thought content includes homicidal and suicidal ideation. Thought content includes homicidal and suicidal plan.        Judgment: Judgment is impulsive.   Review of Systems  Constitutional:  Negative for chills and fever.  Respiratory:  Negative for cough and shortness of breath.   Cardiovascular:  Negative for chest pain.  Gastrointestinal:  Negative for abdominal pain, nausea and vomiting.  Neurological:  Negative for headaches.  Psychiatric/Behavioral:  Positive for hallucinations and suicidal ideas. Negative for depression.        Endorses homicidal ideation.   Blood pressure (!) 153/89, pulse (!) 57, temperature 98.9 F (37.2 C),  temperature source Oral, resp. rate 18, height 5\' 9"  (1.753 m), weight 104.3 kg, SpO2 98 %. Body mass index is 33.97 kg/m.   Demographic Factors:  Male  Loss Factors: Financial problems/change in socioeconomic status  Historical Factors: NA  Risk Reduction Factors:   NA  Continued Clinical Symptoms:  Previous Psychiatric Diagnoses and Treatments  Cognitive Features That Contribute To Risk:  None    Suicide Risk:  Extreme:  Frequent, intense, and enduring suicidal ideation, specific plans, clear subjective and objective intent, impaired self-control, severe dysphoria/symptomatology, many risk factors and no protective factors.    Plan Of Care/Follow-up recommendations:  Other:  Patient is not psychiatrically cleared. He is from [facility]  and is a good candidate for [facility] unit at [facility].   Disposition: Patient to be discharged to [facility] unit at [facility] for stabilization. Will transport via safe transport per ED staff. Patient agrees with plan to go to [facility] for stabilization.   EDP, ED staff, behavorial health coordinator notified of disposition. [facility] will be accepting provider at [facility]    [facility], NP 02/22/2021, 11:59 AM
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
## 2021-02-22 NOTE — ED Notes (Signed)
Pt sleeping at present, no distress noted, monitoring for safety. 

## 2021-02-22 NOTE — ED Notes (Signed)
Pt dressed out into purple scrubs and grey socks. Pt's belongings of a pair of pants, shirt, pair of boots, toothbrush, toothpaste, and two plastic bags placed in cabinet in 5-8 area.

## 2021-02-22 NOTE — ED Notes (Signed)
Pt oriented to unit and given snack.

## 2021-02-22 NOTE — ED Notes (Signed)
[facility] Case Manager Willeen Niece [phone]

## 2021-02-23 DIAGNOSIS — F1910 Other psychoactive substance abuse, uncomplicated: Secondary | ICD-10-CM | POA: Diagnosis not present

## 2021-02-23 DIAGNOSIS — R45850 Homicidal ideations: Secondary | ICD-10-CM | POA: Diagnosis not present

## 2021-02-23 DIAGNOSIS — R45851 Suicidal ideations: Secondary | ICD-10-CM | POA: Diagnosis not present

## 2021-02-23 DIAGNOSIS — F39 Unspecified mood [affective] disorder: Secondary | ICD-10-CM | POA: Diagnosis not present

## 2021-02-23 MED ORDER — VALACYCLOVIR HCL 500 MG PO TABS
500.0000 mg | ORAL_TABLET | Freq: Two times a day (BID) | ORAL | Status: DC
  Administered 2021-02-23 – 2021-02-28 (×11): 500 mg via ORAL
  Filled 2021-02-23 (×11): qty 1

## 2021-02-23 MED ORDER — HYDROCERIN EX CREA
1.0000 "application " | TOPICAL_CREAM | CUTANEOUS | Status: DC
  Administered 2021-02-23 – 2021-02-28 (×7): 1 via TOPICAL
  Filled 2021-02-23 (×6): qty 113

## 2021-02-23 MED ORDER — VITAMIN B-12 1000 MCG PO TABS
1000.0000 ug | ORAL_TABLET | Freq: Every day | ORAL | Status: DC
  Administered 2021-02-23 – 2021-02-28 (×6): 1000 ug via ORAL
  Filled 2021-02-23 (×6): qty 1

## 2021-02-23 MED ORDER — RISPERIDONE 2 MG PO TBDP: 2.0000 mg | ORAL_TABLET | Freq: Three times a day (TID) | ORAL | Status: DC | PRN

## 2021-02-23 MED ORDER — LORAZEPAM 1 MG PO TABS
1.0000 mg | ORAL_TABLET | ORAL | Status: AC | PRN
Start: 2021-02-23 — End: 2021-02-28
  Administered 2021-02-28: 1 mg via ORAL
  Filled 2021-02-23: qty 1

## 2021-02-23 MED ORDER — ZIPRASIDONE MESYLATE 20 MG IM SOLR: 20.0000 mg | INTRAMUSCULAR | Status: DC | PRN

## 2021-02-23 NOTE — Progress Notes (Signed)
Pt is currently asleep. Respirations are even and unlabored. Administered scheduled meds with no incident. Pt denies SI. Pt stated that he "will let the police do that for him." Pt continues to endorse HI towards the Housing authority. Pt denies AVH ath this time. Staff will monitor for pt's safety.

## 2021-02-23 NOTE — Progress Notes (Signed)
Pt is watching TV in the dinning area. No signs of acute distress noted. Pt's safety is maintained.

## 2021-02-23 NOTE — ED Notes (Signed)
Pt sleeping at present, no distress noted,  Monitoring for safety. 

## 2021-02-23 NOTE — Progress Notes (Addendum)
Pt is currently in MHT group therapy.

## 2021-02-23 NOTE — Progress Notes (Addendum)
Pt is presently in SW group therapy.

## 2021-02-23 NOTE — Progress Notes (Addendum)
Patient requested CSW to contact his case manager with [facility] [phone]) regarding housing and possible substance use resources.   CSW spoke with receptionist, who ensured she would relay the request for an update to the patient's caseworker and will have them give me a call back.   CSW will continue to follow.  Ethan [name], MSW, LCSW Clinical Child psychotherapist (Facility Based Crisis) [facility]

## 2021-02-23 NOTE — ED Provider Notes (Addendum)
Behavioral Health Progress Note  Date and Time: 02/23/2021 10:22 AM Name: Ethan [name] MRN:  [id number]  Subjective: Patient is reassessed face-to-face by nurse practitioner.  He is assessed and crisis area, no acute distress.  He is alert and oriented, cooperative during assessment.  He presents with labile mood, becomes angry when discussing situation leading to presentation here at [facility] behavioral health.   Ethan [name] denies active suicidal and homicidal ideations currently. He contracts verbally for safety with this Clinical research associate.  He is insightful regarding situation.  He is states "I do not want to do anything to the women at [facility], I want to stay away from all the stupid stuff."  He reports feeling triggered by staff at the [employer] and would prefer to seek housing elsewhere.  He reports he would like to transition into a shelter near [place], [place].  He reports he does have natural supports in the [facility] area. He has normal speech and behavior.  He denies both auditory and visual hallucinations.  Patient is able to converse coherently with goal-directed thoughts and no distractibility or preoccupation.  He denies paranoia.  Objectively there is no evidence of psychosis/mania or delusional thinking.  Ethan [name] reports he has recently relapsed on drugs after being evicted from his housing.  He reports he believed he was using opiates but now understands the substance may have been cocaine.  He is ready to stop using cocaine and all other substances at this time.  He reports average sleep and appetite here at facility based crisis.  Reports he is tolerating medications well.  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  He reports this case manager assist with housing, medications and substance use disorder.  Additionally she coordinates his care through infectious disease.  This case manager has been seeking  alternative housing for [place] for several weeks.  He reports plan to follow-up with case manager today for update on possible placements.  Patient offered support and encouragement.   Diagnosis:  Final diagnoses:  Mood disorder (HCC)  Polysubstance abuse (HCC)    Total Time spent with patient: 20 minutes  Past Psychiatric History: patient reports history of bipolar disorder and cocaine use disorder Past Medical History:  Past Medical History:  Diagnosis Date   Fungal infection    on hand   HIV infection (HCC)    Hyperlipemia    Hypertension    Kidney stone    Substance abuse (HCC)     Past Surgical History:  Procedure Laterality Date   HERNIA REPAIR     KIDNEY STONE SURGERY  2014   removed surgically - Dr Lindley Magnus   Family History: History reviewed. No pertinent family history. Family Psychiatric  History: None reported Social History:  Social History   Substance and Sexual Activity  Alcohol Use Yes   Comment: 1/4 gallon daily     Social History   Substance and Sexual Activity  Drug Use Yes   Types: Cocaine, Marijuana    Social History   Socioeconomic History   Marital status: Single    Spouse name: Not on file   Number of children: Not on file   Years of education: Not on file   Highest education level: Not on file  Occupational History   Not on file  Tobacco Use   Smoking status: Every Day   Smokeless tobacco: Never  Substance and Sexual Activity   Alcohol use: Yes    Comment:  1/4 gallon daily   Drug use: Yes    Types: Cocaine, Marijuana   Sexual activity: Not on file  Other Topics Concern   Not on file  Social History Narrative   Not on file   Social Determinants of Health   Financial Resource Strain: Not on file  Food Insecurity: Not on file  Transportation Needs: Not on file  Physical Activity: Not on file  Stress: Not on file  Social Connections: Not on file   SDOH:  SDOH Screenings   Alcohol Screen: Not on file  Depression  (PHQ2-9): Not on file  Financial Resource Strain: Not on file  Food Insecurity: Not on file  Housing: Not on file  Physical Activity: Not on file  Social Connections: Not on file  Stress: Not on file  Tobacco Use: High Risk   Smoking Tobacco Use: Every Day   Smokeless Tobacco Use: Never  Transportation Needs: Not on file   Additional Social History:                         Sleep: Fair  Appetite:  Good  Current Medications:  Current Facility-Administered Medications  Medication Dose Route Frequency Provider Last Rate Last Admin   acetaminophen (TYLENOL) tablet 650 mg  650 mg Oral Q6H PRN Estella Husk, MD       alum & mag hydroxide-simeth (MAALOX/MYLANTA) 200-200-20 MG/5ML suspension 30 mL  30 mL Oral Q4H PRN Estella Husk, MD       bictegravir-emtricitabine-tenofovir AF (BIKTARVY) 50-200-25 MG per tablet 1 tablet  1 tablet Oral Daily Estella Husk, MD   1 tablet at 02/23/21 1610   hydrOXYzine (ATARAX/VISTARIL) tablet 25 mg  25 mg Oral TID PRN Estella Husk, MD   25 mg at 02/22/21 2106   lisinopril (ZESTRIL) tablet 20 mg  20 mg Oral Daily Estella Husk, MD   20 mg at 02/23/21 0904   magnesium hydroxide (MILK OF MAGNESIA) suspension 30 mL  30 mL Oral Daily PRN Estella Husk, MD   30 mL at 02/22/21 2133   potassium chloride SA (KLOR-CON) CR tablet 20 mEq  20 mEq Oral BID Estella Husk, MD   20 mEq at 02/23/21 0905   risperiDONE (RISPERDAL) tablet 1 mg  1 mg Oral BID Estella Husk, MD   1 mg at 02/23/21 0905   rosuvastatin (CRESTOR) tablet 40 mg  40 mg Oral Daily Estella Husk, MD   40 mg at 02/23/21 9604   Current Outpatient Medications  Medication Sig Dispense Refill   betamethasone valerate ointment (VALISONE) 0.1 % Apply 1 application topically daily as needed. Apply to affected area.     bictegravir-emtricitabine-tenofovir AF (BIKTARVY) 50-200-25 MG TABS tablet Take 1 tablet by mouth daily.     Emollient  (EUCERIN INTENSIVE REPAIR) LOTN Apply 1 Bottle topically 2 (two) times daily in the am and at bedtime.[place] Kitchen Apply to affected area.     ergocalciferol (VITAMIN D2) 1.25 MG (50000 UT) capsule Take 50,000 Units by mouth every Monday.     ketoconazole (NIZORAL) 2 % cream Apply 1 application topically daily. Apply to affected area.     lisinopril-hydrochlorothiazide (ZESTORETIC) 20-12.5 MG tablet Take 1 tablet by mouth daily.     potassium chloride SA (KLOR-CON) 20 MEQ tablet Take 20 mEq by mouth daily.     rosuvastatin (CRESTOR) 40 MG tablet Take 40 mg by mouth daily.     valACYclovir (VALTREX) 500 MG tablet  Take 1 tablet (500 mg total) by mouth 2 (two) times daily. For herpes     vitamin B-12 (CYANOCOBALAMIN) 1000 MCG tablet Take 1,000 mcg by mouth daily.      Labs  Lab Results:  Admission on 02/21/2021, Discharged on 02/22/2021  Component Date Value Ref Range Status   Sodium 02/21/2021 139  135 - 145 mmol/L Final   Potassium 02/21/2021 3.0 (A) 3.5 - 5.1 mmol/L Final   Chloride 02/21/2021 101  98 - 111 mmol/L Final   CO2 02/21/2021 27  22 - 32 mmol/L Final   Glucose, Bld 02/21/2021 92  70 - 99 mg/dL Final   Glucose reference range applies only to samples taken after fasting for at least 8 hours.   BUN 02/21/2021 12  6 - 20 mg/dL Final   Creatinine, Ser 02/21/2021 1.02  0.61 - 1.24 mg/dL Final   Calcium 16/04/9603 9.4  8.9 - 10.3 mg/dL Final   Total Protein 54/03/8118 8.1  6.5 - 8.1 g/dL Final   Albumin 14/78/2956 4.5  3.5 - 5.0 g/dL Final   AST 21/30/8657 42 (A) 15 - 41 U/L Final   ALT 02/21/2021 35  0 - 44 U/L Final   Alkaline Phosphatase 02/21/2021 58  38 - 126 U/L Final   Total Bilirubin 02/21/2021 0.9  0.3 - 1.2 mg/dL Final   GFR, Estimated 02/21/2021 >60  >60 mL/min Final   Comment: (NOTE) Calculated using the CKD-EPI Creatinine Equation (2021)    Anion gap 02/21/2021 11  5 - 15 Final   Performed at [facility], [address] [place], [place] [address]    Alcohol, Ethyl (B) 02/21/2021 <10  <10 mg/dL Final   Comment: (NOTE) Lowest detectable limit for serum alcohol is 10 mg/dL.  For medical purposes only. Performed at [facility], [address] [place], [place] [address]    Opiates 02/21/2021 NONE DETECTED  NONE DETECTED Final   Cocaine 02/21/2021 POSITIVE (A) NONE DETECTED Final   Benzodiazepines 02/21/2021 NONE DETECTED  NONE DETECTED Final   Amphetamines 02/21/2021 NONE DETECTED  NONE DETECTED Final   Tetrahydrocannabinol 02/21/2021 POSITIVE (A) NONE DETECTED Final   Barbiturates 02/21/2021 NONE DETECTED  NONE DETECTED Final   Comment: (NOTE) DRUG SCREEN FOR MEDICAL PURPOSES ONLY.  IF CONFIRMATION IS NEEDED FOR ANY PURPOSE, NOTIFY LAB WITHIN 5 DAYS.  LOWEST DETECTABLE LIMITS FOR URINE DRUG SCREEN Drug Class                     Cutoff (ng/mL) Amphetamine and metabolites    1000 Barbiturate and metabolites    200 Benzodiazepine                 200 Tricyclics and metabolites     300 Opiates and metabolites        300 Cocaine and metabolites        300 THC                            50 Performed at [facility], [address], [place], [place] [address]    WBC 02/21/2021 8.5  4.0 - 10.5 K/uL Final   RBC 02/21/2021 4.43  4.22 - 5.81 MIL/uL Final   Hemoglobin 02/21/2021 13.9  13.0 - 17.0 g/dL Final   HCT 40/04/2724 40.5  39.0 - 52.0 % Final   MCV 02/21/2021 91.4  80.0 - 100.0 fL Final   MCH 02/21/2021 31.4  26.0 - 34.0 pg Final   MCHC 02/21/2021 34.3  30.0 - 36.0 g/dL Final   RDW 67/61/9509 14.7  11.5 - 15.5 % Final   Platelets 02/21/2021 270  150 - 400 K/uL Final   nRBC 02/21/2021 0.0  0.0 - 0.2 % Final   Neutrophils Relative % 02/21/2021 61  % Final   Neutro Abs 02/21/2021 5.3  1.7 - 7.7 K/uL Final   Lymphocytes Relative 02/21/2021 29  % Final   Lymphs Abs 02/21/2021 2.5  0.7 - 4.0 K/uL Final   Monocytes Relative 02/21/2021 7  % Final   Monocytes Absolute 02/21/2021 0.6  0.1 - 1.0  K/uL Final   Eosinophils Relative 02/21/2021 1  % Final   Eosinophils Absolute 02/21/2021 0.0  0.0 - 0.5 K/uL Final   Basophils Relative 02/21/2021 1  % Final   Basophils Absolute 02/21/2021 0.0  0.0 - 0.1 K/uL Final   Immature Granulocytes 02/21/2021 1  % Final   Abs Immature Granulocytes 02/21/2021 0.08 (A) 0.00 - 0.07 K/uL Final   Performed at [facility], [address] [place], [place] [address]   SARS Coronavirus 2 by RT PCR 02/22/2021 NEGATIVE  NEGATIVE Final   Comment: (NOTE) SARS-CoV-2 target nucleic acids are NOT DETECTED.  The SARS-CoV-2 RNA is generally detectable in upper respiratory specimens during the acute phase of infection. The lowest concentration of SARS-CoV-2 viral copies this assay can detect is 138 copies/mL. A negative result does not preclude SARS-Cov-2 infection and should not be used as the sole basis for treatment or other patient management decisions. A negative result may occur with  improper specimen collection/handling, submission of specimen other than nasopharyngeal swab, presence of viral mutation(s) within the areas targeted by this assay, and inadequate number of viral copies(<138 copies/mL). A negative result must be combined with clinical observations, patient history, and epidemiological information. The expected result is Negative.  Fact Sheet for Patients:  BloggerCourse.com  Fact Sheet for Healthcare Providers:  SeriousBroker.it  This test is no                          t yet approved or cleared by the [place] FDA and  has been authorized for detection and/or diagnosis of SARS-CoV-2 by FDA under an Emergency Use Authorization (EUA). This EUA will remain  in effect (meaning this test can be used) for the duration of the COVID-19 declaration under Section 564(b)(1) of the Act, 21 U.S.C.section 360bbb-3(b)(1), unless the authorization is terminated  or revoked  sooner.       Influenza A by PCR 02/22/2021 NEGATIVE  NEGATIVE Final   Influenza B by PCR 02/22/2021 NEGATIVE  NEGATIVE Final   Comment: (NOTE) The Xpert Xpress SARS-CoV-2/FLU/RSV plus assay is intended as an aid in the diagnosis of influenza from Nasopharyngeal swab specimens and should not be used as a sole basis for treatment. Nasal washings and aspirates are unacceptable for Xpert Xpress SARS-CoV-2/FLU/RSV testing.  Fact Sheet for Patients: BloggerCourse.com  Fact Sheet for Healthcare Providers: SeriousBroker.it  This test is not yet approved or cleared by the [place] FDA and has been authorized for detection and/or diagnosis of SARS-CoV-2 by FDA under an Emergency Use Authorization (EUA). This EUA will remain in effect (meaning this test can be used) for the duration of the COVID-19 declaration under Section 564(b)(1) of the Act, 21 U.S.C. section 360bbb-3(b)(1), unless the authorization is terminated or revoked.  Performed at [facility], [address] [place], [place] [address]     Blood Alcohol level:  Lab Results  Component Value Date   ETH <10 02/21/2021   ETH <11 08/08/2013    Metabolic Disorder Labs: No results found for: HGBA1C, MPG No results found for: PROLACTIN No results found for: CHOL, TRIG, HDL, CHOLHDL, VLDL, LDLCALC  Therapeutic Lab Levels: No results found for: LITHIUM No results found for: VALPROATE No components found for:  CBMZ  Physical Findings   AUDIT    Flowsheet Row [facility]  Alcohol Use Disorder Identification Test Final Score (AUDIT) 22 22      Flowsheet Row ED from 02/22/2021 in [facility] ED from 02/21/2021 in [facility]   C-SSRS RISK CATEGORY No Risk No Risk        Musculoskeletal  Strength & Muscle Tone: within normal limits Gait & Station: normal Patient leans: N/A  Psychiatric Specialty Exam  Presentation  General Appearance: Appropriate for Environment; Casual  Eye Contact:Good  Speech:Clear and Coherent; Normal Rate  Speech Volume:Normal  Handedness:Right   Mood and Affect  Mood:Angry  Affect:Congruent   Thought Process  Thought Processes:Coherent; Goal Directed  Descriptions of Associations:Intact  Orientation:Full (Time, Place and Person)  Thought Content:Logical     Hallucinations:Hallucinations: None Description of Auditory Hallucinations: spiritual voices. thel him "to burn their assess up" Description of Visual Hallucinations: spiritual things  Ideas of Reference:Delusions  Suicidal Thoughts:Suicidal Thoughts: No SI Active Intent and/or Plan: Without Intent; Without Plan  Homicidal Thoughts:Homicidal Thoughts: Yes, Active HI Active Intent and/or Plan: With Intent; With Plan; Without Means to Carry Out   Sensorium  Memory:Immediate Good; Recent Good; Remote Good  Judgment:Fair  Insight:Fair   Executive Functions  Concentration:Good  Attention Span:Good  Recall:Good  Fund of Knowledge:Good  Language:Good   Psychomotor Activity  Psychomotor Activity:Psychomotor Activity: Normal   Assets  Assets:Communication Skills; Desire for Improvement; Leisure Time; Physical Health; Resilience; Social Support   Sleep  Sleep:Sleep: Fair   No data recorded  Physical Exam  Physical Exam Constitutional:      Appearance: Normal appearance.  HENT:     Head: Normocephalic and atraumatic.     Nose: Nose normal.  Cardiovascular:     Rate and Rhythm: Normal rate.  Pulmonary:     Effort: Pulmonary effort is normal.  Musculoskeletal:        General: Normal range of motion.     Cervical back: Normal range of motion.  Skin:    General: Skin is warm and  dry.  Neurological:     Mental Status: He is alert and oriented to person, place, and time.  Psychiatric:        Attention and Perception: Attention and perception normal.        Mood and Affect: Mood normal. Affect is labile.        Speech: Speech normal.        Behavior: Behavior normal. Behavior is cooperative.        Thought Content: Thought content includes homicidal ideation. Thought content includes homicidal plan.        Cognition and Memory: Cognition and memory normal.   Review of Systems  Constitutional: Negative.   HENT: Negative.    Eyes: Negative.   Respiratory: Negative.    Cardiovascular: Negative.   Gastrointestinal: Negative.   Genitourinary: Negative.   Musculoskeletal: Negative.  Skin: Negative.   Neurological: Negative.   Endo/Heme/Allergies: Negative.   Psychiatric/Behavioral:  Positive for substance abuse.   Blood pressure 129/84, pulse (!) 58, temperature 98.3 F (36.8 C), temperature source Oral, resp. rate 16, height 5\' 9"  (1.753 m), weight 230 lb (104.3 kg), SpO2 98 %. Body mass index is 33.97 kg/m.  Treatment Plan Summary:  Patient reviewed with Dr. Bronwen BettersLaubach. Daily contact with patient to assess and evaluate symptoms and progress in treatment Restarted home medications, as requested by patient, including: -Eucerin topical cream 1 application twice daily -Valacyclovir 500 mg twice daily -Vitamin B12 1000 mcg daily Additionally initiated as needed agitation protocol including: -Risperidone 2 mg every 8 as needed/agitation -Lorazepam 1 mg as needed once/severe agitation -Ziprasidone 20 mg IM as needed 1/agitation  Lenard Lanceina L Duane Trias, FNP 02/23/2021 10:22 AM

## 2021-02-23 NOTE — Group Therapy Note (Signed)
Staff encouraged the patient to discuss his feelings about his diagnoses. Staff explained that the situation that the patient was in was only temporary and did not have to be the end of the story. Staff suggested the patient write out his goal and create a plan that would allow him to reach his goal. Staff thanked the patient for participating in group and encouraged him to continue to work towards his goal.  Patient said that he was not sure of his feelings earlier today however he now realizes that he needs to make a significant change that he does not allow his diagnosis to control him. Patient states this is not the end for him and he is considering completing his GED. Patient ackowledged that he feels a lot better than he did earlier. Patient continued to do well.

## 2021-02-23 NOTE — Clinical Social Work Psych Note (Addendum)
CSW met with patient for introduction, to complete PCP and to discuss possible after care plans. Patient was pleasant and cooperative throughout the meeting.  Patient shared his experiences with [facility] and why he expressed HI towards the agency. Patient continues to express these homicidal statements due to his feelings of mistreatment and discrimination he has experienced while seeking housing services/resources.     Patient shared with CSW his past and current substance use issues. He endorsed THC and cocaine use. Patient expressed interest in seeking residential substance use treatment for continuity of care. Patient also shared that he is currently homeless and stays "here and there".   Patient denied any SI or AVH at this time. Patient continues to endorse "passive" HI towards [facility]. CSW explained to patient that his current HI could effect the outcome of placement. Patient expressed understanding.   CSW will contact and refer patient to appropriate facilities for possible placement. CSW will continue to follow.    Radonna Ricker, MSW, LCSW Clinical Education officer, museum ([place]) [facility]

## 2021-02-23 NOTE — Clinical Social Work Note (Signed)
Feelings Surrounding Diagnosis  Feelings Surrounding Diagnosis (Processing Group)  Date: 02/23/21  Type of Therapy/Therapeutic Modalities: Group Discussion, Psycho-Education  Participation Level: Active  Objective: This group will allow patients to explore their thoughts and feelings about diagnoses they have received. Patients will be guided to explore their level of understanding and acceptance of these diagnoses. Facilitator will encourage patients to process their thoughts and feelings about the reactions of others to their diagnosis and will guide patients in identifying ways to discuss their diagnosis with significant others in their lives. This group will be process-oriented, with patients participating in exploration of their own experiences, giving, and receiving support, and processing challenge from other group members.  Therapeutic Goals:  Patient will demonstrate understanding of diagnosis as evidenced by identifying two or more symptoms of the disorder Patient will be able to express two feelings regarding the diagnosis Patient will demonstrate their ability to communicate their needs through discussion and/or role play  Summary of Patient Progress:    Harlie was engaged and participated throughout the group session. Usiel reports that living with a mental health diagnosis can be hard because of "everyone's lack of understanding". He shared personal stories of how his mental health diagnosis effected certain relationships with family members. Alyssa reports that the more people learn about mental health and are effected by it, the better treatment and acceptance will be in the future.

## 2021-02-23 NOTE — ED Notes (Signed)
Pt in dining room watching TV and interacting with other patients. Calm and cooperative with no signs of distress. Will continue to monitor for safety.

## 2021-02-23 NOTE — Group Therapy Note (Addendum)
Crisis Management Monday - Crisis Management (Identifying Triggers/Coping Skills)  Date: 02/23/21  Type of Therapy/Therapeutic Modalities: Group, CBT, Motivational Interviewing, Supportive, Psych-Educational  Participation Level: Active  Objectives: To assist patients in identifying triggers and how their effects contribute to their mental health and overall well-being. Facilitators will guide discussions around identifying coping mechanisms that are unique and effective for each group member.   Therapeutic Goals: 1. Patient will identify triggers and the emotions associated with them to evaluate their    behavioral responses.  2. Patient will participate in group discussion discussing why they believe these triggers cause certain responses.  3. Patient will explore healthy coping mechanisms.  4. Patient participate in group discussion sharing how they plan to implement these    coping strategies in their day-to-day life and overall road to recovery.   Summary of Patient's Progress:   [place] was engaged and participated throughout the group session. Adden reports that he gets mad when he feel like people don't help him when he is in need.  Facilitator encouraged Belvin to process his feelings of being mad. Alexx reported that he would take that into consideration.

## 2021-02-23 NOTE — ED Notes (Signed)
Pt is in bed sleeping, breathing is even and unlabored, environment check complete/secure. Will continue to monitor patient for safety

## 2021-02-24 DIAGNOSIS — F1910 Other psychoactive substance abuse, uncomplicated: Secondary | ICD-10-CM | POA: Diagnosis not present

## 2021-02-24 DIAGNOSIS — R45850 Homicidal ideations: Secondary | ICD-10-CM | POA: Diagnosis not present

## 2021-02-24 DIAGNOSIS — R45851 Suicidal ideations: Secondary | ICD-10-CM | POA: Diagnosis not present

## 2021-02-24 DIAGNOSIS — F39 Unspecified mood [affective] disorder: Secondary | ICD-10-CM | POA: Diagnosis not present

## 2021-02-24 NOTE — ED Notes (Signed)
Pt c/o constipation. Milk of Mag 30 mL PRN given.

## 2021-02-24 NOTE — ED Notes (Signed)
Pt sleeping at present, no distress noted.  Monitoring for safety. 

## 2021-02-24 NOTE — ED Notes (Signed)
Pt alert and oriented X4 on the unit. Pt denies SI/HI, A/VH. Pt has been engaging with other pts while watching television in the dayroom. Pt also participated during unit groups and activities today. Education, support and encouragement provided, q15 minute safety checks remain in effect. Medications administered per MD orders. No reactions/side effects to medicine noted. Pt denies any concerns at this time, and verbally contracts for safety. Pt ambulating on the unit with no issues. Pt remains safe on and off the unit.

## 2021-02-24 NOTE — Clinical Social Work Psych Note (Addendum)
Patient continues to express interest in possible placement for residential substance use services.   CSW has contacted the following facilities for possible placement.,    [facility] - no beds at this time  [facility] - no answer, left vm with admissions  [facility] - Admissions report there is no beds available and that there is a a 1 week waitlist  [facility] - no beds available, currently on a 4 week waitlist    Patient is homeless and has agreed to shelter resources as an alternative with outpatient SA treatment services in place.   CSW will continue to follow.    Baldo Daub, MSW, LCSW Clinical Child psychotherapist (Facility Based Crisis) [facility]

## 2021-02-24 NOTE — ED Provider Notes (Signed)
Behavioral Health Progress Note  Date and Time: 02/24/2021 10:06 AM Name: Ethan [name] MRN:  [id number]  Subjective:   Patient seen and chart reviewed. He has been medication compliant. Pt observed eating breakfast this AM in the cafeteria area. He is interviewed in his room in conjunction with SW. He is laying in bed in NAD. He reports tolerating risperdal well, denies SE apart from it making him "sleepy" . He reports eating and sleeping well. He denies SI/AVH. He continues to express passive HI towards housing authority-"If I be close to them, its going to happen". No plan or intent. Patient shows good insight into his substance use. He expresses that he would like to attend rehab and that he is concerned if he goes to a shelter that he may be tempted to use. Discussed options for referral-pt is agreeable to go to [facility] or [facility] for treatment if necessary. No other concerns or complaints apart from some left knee pain which he attributes to "bad arthritis".Discussed PRN medications that are ordered for pain and other issues-pt verbalized understanding  Diagnosis:  Final diagnoses:  Mood disorder (HCC)  Polysubstance abuse (HCC)    Total Time spent with patient: 15 minutes  Past Psychiatric History:  Past Medical History:  Past Medical History:  Diagnosis Date   Fungal infection    on hand   HIV infection (HCC)    Hyperlipemia    Hypertension    Kidney stone    Substance abuse (HCC)     Past Surgical History:  Procedure Laterality Date   HERNIA REPAIR     KIDNEY STONE SURGERY  2014   removed surgically - Dr Lindley Magnus   Family History: History reviewed. No pertinent family history. Family Psychiatric  History: see H&P Social History:  Social History   Substance and Sexual Activity  Alcohol Use Yes   Comment: 1/4 gallon daily     Social History   Substance and Sexual Activity  Drug Use Yes   Types: Cocaine, Marijuana    Social History   Socioeconomic History    Marital status: Single    Spouse name: Not on file   Number of children: Not on file   Years of education: Not on file   Highest education level: Not on file  Occupational History   Not on file  Tobacco Use   Smoking status: Every Day   Smokeless tobacco: Never  Substance and Sexual Activity   Alcohol use: Yes    Comment: 1/4 gallon daily   Drug use: Yes    Types: Cocaine, Marijuana   Sexual activity: Not on file  Other Topics Concern   Not on file  Social History Narrative   Not on file   Social Determinants of Health   Financial Resource Strain: Not on file  Food Insecurity: Not on file  Transportation Needs: Not on file  Physical Activity: Not on file  Stress: Not on file  Social Connections: Not on file   SDOH:  SDOH Screenings   Alcohol Screen: Not on file  Depression (PHQ2-9): Not on file  Financial Resource Strain: Not on file  Food Insecurity: Not on file  Housing: Not on file  Physical Activity: Not on file  Social Connections: Not on file  Stress: Not on file  Tobacco Use: High Risk   Smoking Tobacco Use: Every Day   Smokeless Tobacco Use: Never  Transportation Needs: Not on file   Additional Social History:  Sleep: Fair  Appetite:  Good  Current Medications:  Current Facility-Administered Medications  Medication Dose Route Frequency Provider Last Rate Last Admin   acetaminophen (TYLENOL) tablet 650 mg  650 mg Oral Q6H PRN Estella Husk, MD       alum & mag hydroxide-simeth (MAALOX/MYLANTA) 200-200-20 MG/5ML suspension 30 mL  30 mL Oral Q4H PRN Estella Husk, MD       bictegravir-emtricitabine-tenofovir AF (BIKTARVY) 50-200-25 MG per tablet 1 tablet  1 tablet Oral Daily Estella Husk, MD   1 tablet at 02/24/21 7078   hydrocerin (EUCERIN) cream 1 application  1 application Topical BH-qamhs Lenard Lance, FNP   1 application at 02/24/21 6754   hydrOXYzine (ATARAX/VISTARIL) tablet 25 mg  25 mg  Oral TID PRN Estella Husk, MD   25 mg at 02/22/21 2106   lisinopril (ZESTRIL) tablet 20 mg  20 mg Oral Daily Estella Husk, MD   20 mg at 02/24/21 4920   risperiDONE (RISPERDAL M-TABS) disintegrating tablet 2 mg  2 mg Oral Q8H PRN Lenard Lance, FNP       And   LORazepam (ATIVAN) tablet 1 mg  1 mg Oral PRN Lenard Lance, FNP       And   ziprasidone (GEODON) injection 20 mg  20 mg Intramuscular PRN Lenard Lance, FNP       magnesium hydroxide (MILK OF MAGNESIA) suspension 30 mL  30 mL Oral Daily PRN Estella Husk, MD   30 mL at 02/22/21 2133   potassium chloride SA (KLOR-CON) CR tablet 20 mEq  20 mEq Oral BID Estella Husk, MD   20 mEq at 02/24/21 0920   risperiDONE (RISPERDAL) tablet 1 mg  1 mg Oral BID Estella Husk, MD   1 mg at 02/24/21 0920   rosuvastatin (CRESTOR) tablet 40 mg  40 mg Oral Daily Estella Husk, MD   40 mg at 02/24/21 0920   valACYclovir (VALTREX) tablet 500 mg  500 mg Oral BID Lenard Lance, FNP   500 mg at 02/24/21 0920   vitamin B-12 (CYANOCOBALAMIN) tablet 1,000 mcg  1,000 mcg Oral Daily Lenard Lance, FNP   1,000 mcg at 02/24/21 0920   Current Outpatient Medications  Medication Sig Dispense Refill   betamethasone valerate ointment (VALISONE) 0.1 % Apply 1 application topically daily as needed. Apply to affected area.     bictegravir-emtricitabine-tenofovir AF (BIKTARVY) 50-200-25 MG TABS tablet Take 1 tablet by mouth daily.     Emollient (EUCERIN INTENSIVE REPAIR) LOTN Apply 1 Bottle topically 2 (two) times daily in the am and at bedtime.Marland Kitchen Apply to affected area.     ergocalciferol (VITAMIN D2) 1.25 MG (50000 UT) capsule Take 50,000 Units by mouth every Monday.     ketoconazole (NIZORAL) 2 % cream Apply 1 application topically daily. Apply to affected area.     lisinopril-hydrochlorothiazide (ZESTORETIC) 20-12.5 MG tablet Take 1 tablet by mouth daily.     potassium chloride SA (KLOR-CON) 20 MEQ tablet Take 20 mEq by mouth daily.      rosuvastatin (CRESTOR) 40 MG tablet Take 40 mg by mouth daily.     valACYclovir (VALTREX) 500 MG tablet Take 1 tablet (500 mg total) by mouth 2 (two) times daily. For herpes     vitamin B-12 (CYANOCOBALAMIN) 1000 MCG tablet Take 1,000 mcg by mouth daily.      Labs  Lab Results:  Admission on 02/21/2021, Discharged on 02/22/2021  Component Date Value  Ref Range Status   Sodium 02/21/2021 139  135 - 145 mmol/L Final   Potassium 02/21/2021 3.0 (A) 3.5 - 5.1 mmol/L Final   Chloride 02/21/2021 101  98 - 111 mmol/L Final   CO2 02/21/2021 27  22 - 32 mmol/L Final   Glucose, Bld 02/21/2021 92  70 - 99 mg/dL Final   Glucose reference range applies only to samples taken after fasting for at least 8 hours.   BUN 02/21/2021 12  6 - 20 mg/dL Final   Creatinine, Ser 02/21/2021 1.02  0.61 - 1.24 mg/dL Final   Calcium 13/24/401008/07/2020 9.4  8.9 - 10.3 mg/dL Final   Total Protein 27/25/366408/07/2020 8.1  6.5 - 8.1 g/dL Final   Albumin 40/34/742508/07/2020 4.5  3.5 - 5.0 g/dL Final   AST 95/63/875608/07/2020 42 (A) 15 - 41 U/L Final   ALT 02/21/2021 35  0 - 44 U/L Final   Alkaline Phosphatase 02/21/2021 58  38 - 126 U/L Final   Total Bilirubin 02/21/2021 0.9  0.3 - 1.2 mg/dL Final   GFR, Estimated 02/21/2021 >60  >60 mL/min Final   Comment: (NOTE) Calculated using the CKD-EPI Creatinine Equation (2021)    Anion gap 02/21/2021 11  5 - 15 Final   Performed at [facility], [address] [place], [place] [address]   Alcohol, Ethyl (B) 02/21/2021 <10  <10 mg/dL Final   Comment: (NOTE) Lowest detectable limit for serum alcohol is 10 mg/dL.  For medical purposes only. Performed at [facility], [address] [place], [place] [address]    Opiates 02/21/2021 NONE DETECTED  NONE DETECTED Final   Cocaine 02/21/2021 POSITIVE (A) NONE DETECTED Final   Benzodiazepines 02/21/2021 NONE DETECTED  NONE DETECTED Final   Amphetamines 02/21/2021 NONE DETECTED  NONE DETECTED Final   Tetrahydrocannabinol  02/21/2021 POSITIVE (A) NONE DETECTED Final   Barbiturates 02/21/2021 NONE DETECTED  NONE DETECTED Final   Comment: (NOTE) DRUG SCREEN FOR MEDICAL PURPOSES ONLY.  IF CONFIRMATION IS NEEDED FOR ANY PURPOSE, NOTIFY LAB WITHIN 5 DAYS.  LOWEST DETECTABLE LIMITS FOR URINE DRUG SCREEN Drug Class                     Cutoff (ng/mL) Amphetamine and metabolites    1000 Barbiturate and metabolites    200 Benzodiazepine                 200 Tricyclics and metabolites     300 Opiates and metabolites        300 Cocaine and metabolites        300 THC                            50 Performed at [facility], [address] [place], [place] [address]    WBC 02/21/2021 8.5  4.0 - 10.5 K/uL Final   RBC 02/21/2021 4.43  4.22 - 5.81 MIL/uL Final   Hemoglobin 02/21/2021 13.9  13.0 - 17.0 g/dL Final   HCT 30/16/010908/07/2020 40.5  39.0 - 52.0 % Final   MCV 02/21/2021 91.4  80.0 - 100.0 fL Final   MCH 02/21/2021 31.4  26.0 - 34.0 pg Final   MCHC 02/21/2021 34.3  30.0 - 36.0 g/dL Final   RDW 32/35/573208/07/2020 14.7  11.5 - 15.5 % Final   Platelets 02/21/2021 270  150 - 400 K/uL Final   nRBC 02/21/2021 0.0  0.0 - 0.2 % Final  Neutrophils Relative % 02/21/2021 61  % Final   Neutro Abs 02/21/2021 5.3  1.7 - 7.7 K/uL Final   Lymphocytes Relative 02/21/2021 29  % Final   Lymphs Abs 02/21/2021 2.5  0.7 - 4.0 K/uL Final   Monocytes Relative 02/21/2021 7  % Final   Monocytes Absolute 02/21/2021 0.6  0.1 - 1.0 K/uL Final   Eosinophils Relative 02/21/2021 1  % Final   Eosinophils Absolute 02/21/2021 0.0  0.0 - 0.5 K/uL Final   Basophils Relative 02/21/2021 1  % Final   Basophils Absolute 02/21/2021 0.0  0.0 - 0.1 K/uL Final   Immature Granulocytes 02/21/2021 1  % Final   Abs Immature Granulocytes 02/21/2021 0.08 (A) 0.00 - 0.07 K/uL Final   Performed at [facility], [address], [place], [place] [address]   SARS Coronavirus 2 by RT PCR 02/22/2021 NEGATIVE  NEGATIVE Final   Comment:  (NOTE) SARS-CoV-2 target nucleic acids are NOT DETECTED.  The SARS-CoV-2 RNA is generally detectable in upper respiratory specimens during the acute phase of infection. The lowest concentration of SARS-CoV-2 viral copies this assay can detect is 138 copies/mL. A negative result does not preclude SARS-Cov-2 infection and should not be used as the sole basis for treatment or other patient management decisions. A negative result may occur with  improper specimen collection/handling, submission of specimen other than nasopharyngeal swab, presence of viral mutation(s) within the areas targeted by this assay, and inadequate number of viral copies(<138 copies/mL). A negative result must be combined with clinical observations, patient history, and epidemiological information. The expected result is Negative.  Fact Sheet for Patients:  BloggerCourse.com  Fact Sheet for Healthcare Providers:  SeriousBroker.it  This test is no                          t yet approved or cleared by the [place] FDA and  has been authorized for detection and/or diagnosis of SARS-CoV-2 by FDA under an Emergency Use Authorization (EUA). This EUA will remain  in effect (meaning this test can be used) for the duration of the COVID-19 declaration under Section 564(b)(1) of the Act, 21 U.S.C.section 360bbb-3(b)(1), unless the authorization is terminated  or revoked sooner.       Influenza A by PCR 02/22/2021 NEGATIVE  NEGATIVE Final   Influenza B by PCR 02/22/2021 NEGATIVE  NEGATIVE Final   Comment: (NOTE) The Xpert Xpress SARS-CoV-2/FLU/RSV plus assay is intended as an aid in the diagnosis of influenza from Nasopharyngeal swab specimens and should not be used as a sole basis for treatment. Nasal washings and aspirates are unacceptable for Xpert Xpress SARS-CoV-2/FLU/RSV testing.  Fact Sheet for Patients: BloggerCourse.com  Fact  Sheet for Healthcare Providers: SeriousBroker.it  This test is not yet approved or cleared by the [place] FDA and has been authorized for detection and/or diagnosis of SARS-CoV-2 by FDA under an Emergency Use Authorization (EUA). This EUA will remain in effect (meaning this test can be used) for the duration of the COVID-19 declaration under Section 564(b)(1) of the Act, 21 U.S.C. section 360bbb-3(b)(1), unless the authorization is terminated or revoked.  Performed at [facility], [address], [place], [place] [address]     Blood Alcohol level:  Lab Results  Component Value Date   [facility] <10 02/21/2021   ETH <11 08/08/2013    Metabolic Disorder Labs: No results found for: HGBA1C, MPG No results found for: PROLACTIN No results found for:  CHOL, TRIG, HDL, CHOLHDL, VLDL, LDLCALC  Therapeutic Lab Levels: No results found for: LITHIUM No results found for: VALPROATE No components found for:  CBMZ  Physical Findings   AUDIT    Flowsheet Row [facility]  Alcohol Use Disorder Identification Test Final Score (AUDIT) 22 22      Flowsheet Row ED from [facility]/22/2021 in [facility] ED from [facility]21/2021 [facility]  C-SSRS RISK CATEGORY No Risk No Risk        Musculoskeletal  Strength & Muscle Tone: within normal limits Gait & Station: normal Patient leans: N/A  Psychiatric Specialty Exam  Presentation  General Appearance: Appropriate for Environment; Casual  Eye Contact:Good  Speech:Clear and Coherent; Normal Rate  Speech Volume:Normal  Handedness:Right   Mood and Affect  Mood:Euthymic  Affect:Appropriate; Congruent   Thought Process  Thought Processes:Coherent; Goal Directed;  Linear  Descriptions of Associations:Intact  Orientation:Full (Time, Place and Person)  Thought Content:WDL     Hallucinations:Hallucinations: None  Ideas of Reference:Delusions  Suicidal Thoughts:Suicidal Thoughts: No  Homicidal Thoughts:Homicidal Thoughts: Yes, Passive (continued HI towards housing authority) HI Active Intent and/or Plan: Without Intent; Without Plan   Sensorium  Memory:Immediate Good; Recent Good; Remote Good  Judgment:Fair  Insight:Fair   Executive Functions  Concentration:Good  Attention Span:Good  Recall:Good  Fund of Knowledge:Good  Language:Good   Psychomotor Activity  Psychomotor Activity:Psychomotor Activity: Normal   Assets  Assets:Communication Skills; Desire for Improvement; Physical Health; Resilience   Sleep  Sleep:Sleep: Fair   No data recorded  Physical Exam  Physical Exam Constitutional:      Appearance: Normal appearance.  HENT:     Head: Normocephalic and atraumatic.  Eyes:     Extraocular Movements: Extraocular movements intact.  Pulmonary:     Effort: Pulmonary effort is normal.  Neurological:     General: No focal deficit present.     Mental Status: He is alert and oriented to person, place, and time.  Psychiatric:        Behavior: Behavior normal.   Review of Systems  Constitutional:  Negative for chills and fever.  HENT:  Negative for hearing loss.   Eyes:  Negative for discharge and redness.  Respiratory:  Negative for cough.   Cardiovascular:  Negative for chest pain.  Gastrointestinal:  Negative for abdominal pain.  Musculoskeletal:  Positive for joint pain. Negative for myalgias.       Left knee pain; attributes to "arthritis"  Neurological:  Negative for headaches.  Psychiatric/Behavioral:  Positive for substance abuse. Negative for depression, hallucinations and suicidal ideas.   Blood pressure 126/73, pulse 93, temperature 98.3 F (36.8 C), temperature source Oral, resp. rate 18, height 5'  9" (1.753 m), weight 104.3 kg, SpO2 90 %. Body mass index is 33.97 kg/m.  Treatment Plan Summary: Daily contact with patient to assess and evaluate symptoms and progress in treatment   Mood disorder R/o bipolar disorder -pt reports hx of bipolar; unclear if patient has experienced sx that would be consistent with manic episodes in the context of sobriety or not -Risperdal 1 mg BID -agitation protocol  HIV -Biktarvy  HTN lisinopril 20 mg  HLD crestor 40 mg  Mood disorder risperdal 1 mg BID  Herpes valtrex 500 mg bID  Hypokalemia -K 3.0 on 02/21/2021 -hypokalemia can result in prolonged qtc --will replete with Potsassium chlroide 20 mg BID  Polysubstance abuse -UDS+cocaine, THC -SW assisting with substance use treatment  Disposition planning is ongoing-likely discharge to residential substance treatment in the next couple days    Estella Husk, MD 02/24/2021 10:06 AM

## 2021-02-24 NOTE — ED Notes (Signed)
Pt becoming upset and mumbling under breathe. States we do not have hot food this is not what he thought it would be. Upset because we do not have hot breakfast.

## 2021-02-24 NOTE — ED Notes (Signed)
Pt c/o L knee pain at 10/10. Tylenol 625 mg PRN given.

## 2021-02-24 NOTE — ED Notes (Signed)
PT continuously complaining about program and lack of hot food. Says he wants to talk to who is in charge because he is frustrated with the cold food. Informed him I will tell doc and social workers of his complaints.

## 2021-02-24 NOTE — ED Notes (Signed)
Spirituality group facilitated by Wilkie Aye, MDiv, BCC.   Group Description:  Group focused on topic of hope.  Patients participated in facilitated discussion around topic, connecting with one another around experiences and definitions for hope.  Group members engaged with visual explorer photos, reflecting on what hope looks like for them today.  Group engaged in discussion around how their definitions of hope are present today in hospital.   Modalities: Psycho-social ed, Adlerian, Narrative, MI  Patient Progress: Sam was present throughout group.  Actively engaged in group activity.  Through group, Sam laid down on chairs and stated that medicine was making him feel "drunk."   Sam reflected on his faith, as a source of hope.  Noted that he is working on forgiving others, keeping perspective, and staying focused on what he needs.

## 2021-02-24 NOTE — ED Notes (Signed)
Patient A/O no signs of distress. Patient still fixated on housing situation and still prevents with HI to staff. Denies SI, AVH.  Patient intrusive,Paranoid and irritable - will continue to monitor for safety

## 2021-02-24 NOTE — ED Notes (Signed)
Pt given cheez-itz and cranberry juice

## 2021-02-24 NOTE — Group Therapy Note (Addendum)
Problem Solving & Overcoming Obstacles Tuesday - Problem Solving & Overcoming Obstacles - Wrap up   Date: 02/24/21  Type of Therapy/Therapeutic Modalities: Group, CBT, Motivational Interviewing, Solution-Focused   Participation Level: Active  Objective - Has this patient met their goal? Yes. Patient voiced frustration on lack of bed availability in substance treatment facilities. Pt noted that they have been problem solving by making phone calls and trying to work out a way to get back to [place], [place] in [place].

## 2021-02-24 NOTE — Group Therapy Note (Signed)
Problem Solving & Overcoming Obstacles Tuesday - Problem Solving & Overcoming Obstacles   Date: 02/24/21   Type of Therapy/Therapeutic Modalities: Group, CBT, Motivational Interviewing, Solution-Focused   Participation Level: Did Not Attend  Objective - In this group patients will be encouraged to explore what they see as problems/obstacles to their own wellness and recovery. They will be guided to discuss their thoughts, feelings, and behaviors related to these obstacles. The group will process together ways to cope with barriers, with attention given to specific choices patients can make. Each patient will be challenged to identify changes they are motivated to make to overcome their obstacles. This group will be process-oriented, with patients participating in exploration of their own experiences as well as giving and receiving support and challenge from other group members.  Therapeutic Goals:  1. Patient will identify personal and current obstacles as they relate to admission. 2. Patient will identify barriers that currently interfere with their wellness or overcoming obstacles.  3. Patient will identify feelings, thought process and behaviors related to these barriers. 4. Patient will identify two changes they are willing to make to overcome these obstacles:   Summary of Patient Progress   

## 2021-02-24 NOTE — ED Notes (Signed)
Pt states that he doesn't know what he's going to say to "those people tomorrow". He says theres two options "give me my housing back or a bullet". Pt states he just finished praying and began speaking about scripture which gave him some hope and optimism

## 2021-02-24 NOTE — ED Notes (Signed)
Patient is awake in their room speaking loudly. Patient mentioning things about drive by shootings and other violent acts.

## 2021-02-25 DIAGNOSIS — F1910 Other psychoactive substance abuse, uncomplicated: Secondary | ICD-10-CM | POA: Diagnosis not present

## 2021-02-25 DIAGNOSIS — R45851 Suicidal ideations: Secondary | ICD-10-CM | POA: Diagnosis not present

## 2021-02-25 DIAGNOSIS — F39 Unspecified mood [affective] disorder: Secondary | ICD-10-CM | POA: Diagnosis not present

## 2021-02-25 DIAGNOSIS — R45850 Homicidal ideations: Secondary | ICD-10-CM | POA: Diagnosis not present

## 2021-02-25 NOTE — ED Notes (Signed)
Patient attended the evening group session and answered all discussion questions prompted from this Clinical research associate. Patient shared his focus today was to find joy in everything instead of seeing the negative. Patient had to be redirected several times however, due to being consumed with "medicare being billed incorrectly because this facility does not serve hot meals." Patient was asked several times to stay on track until group was over.

## 2021-02-25 NOTE — Progress Notes (Signed)
Patient denied SI, AVH.  Stated he only talks to God.  Patient declined to answer when asked about HI.  "I prefer not to answer that."  Patient stated he's unhappy that we don't have hot food.  Offered Ziti which is warm.  "I'm okay right now.  I'm just telling ya'll."

## 2021-02-25 NOTE — ED Notes (Signed)
Patient continues to rest. No sxs of distess noted.  Will continue to monitor for safety

## 2021-02-25 NOTE — ED Provider Notes (Signed)
Behavioral Health Progress Note  Date and Time: 02/25/2021 4:09 PM Name: Ethan [name] MRN:  [id number]  Subjective:   Patient seen and chart reviewed. Pt has been medication compliant. Per chart review, he continues to make comments about having HI towrds the housign authoirty. Patient obsevred making phone calls the majority of the day; he is observed having linear, apporpriate, organized and well articulated  conversations on the phone discussing his difficulties with housing and homelessness. Pt denies SI, plan or intent. When asked about HI, he states that he "pleads the fifth" and states that his lawyer had told him to not mention things about wanting to harm others because he can get in trouble for "communicating threats". No VH reported. He reports AH of hearing God when it is quiet and he is alone in his room doing his self directed bible study. Unclear if these are true AH or realizations that he comes to when he is reading the bible based in the description he provides. He reports sleeping and eating well. Discussed increasing risperdal dose but he declines at this time. He is focused on his housing status throughout interview and asks "are we done" as he had questions for SW regarding housing  Diagnosis:  Final diagnoses:  Mood disorder (HCC)  Polysubstance abuse (HCC)    Total Time spent with patient: 20 minutes  Past Psychiatric History: see H&P Past Medical History:  Past Medical History:  Diagnosis Date   Fungal infection    on hand   HIV infection (HCC)    Hyperlipemia    Hypertension    Kidney stone    Substance abuse (HCC)     Past Surgical History:  Procedure Laterality Date   HERNIA REPAIR     KIDNEY STONE SURGERY  2014   removed surgically - Dr Lindley Magnus   Family History: History reviewed. No pertinent family history. Family Psychiatric  History: see H&P Social History:  Social History   Substance and Sexual Activity  Alcohol Use Yes   Comment: 1/4 gallon  daily     Social History   Substance and Sexual Activity  Drug Use Yes   Types: Cocaine, Marijuana    Social History   Socioeconomic History   Marital status: Single    Spouse name: Not on file   Number of children: Not on file   Years of education: Not on file   Highest education level: Not on file  Occupational History   Not on file  Tobacco Use   Smoking status: Every Day   Smokeless tobacco: Never  Substance and Sexual Activity   Alcohol use: Yes    Comment: 1/4 gallon daily   Drug use: Yes    Types: Cocaine, Marijuana   Sexual activity: Not on file  Other Topics Concern   Not on file  Social History Narrative   Not on file   Social Determinants of Health   Financial Resource Strain: Not on file  Food Insecurity: Not on file  Transportation Needs: Not on file  Physical Activity: Not on file  Stress: Not on file  Social Connections: Not on file   SDOH:  SDOH Screenings   Alcohol Screen: Not on file  Depression (QMV7-8): Not on file  Financial Resource Strain: Not on file  Food Insecurity: Not on file  Housing: Not on file  Physical Activity: Not on file  Social Connections: Not on file  Stress: Not on file  Tobacco Use: High Risk   Smoking Tobacco Use: Every  Day   Smokeless Tobacco Use: Never  Transportation Needs: Not on file   Additional Social History:                         Sleep: Fair  Appetite:  Fair  Current Medications:  Current Facility-Administered Medications  Medication Dose Route Frequency Provider Last Rate Last Admin   acetaminophen (TYLENOL) tablet 650 mg  650 mg Oral Q6H PRN Estella Husk, MD   650 mg at 02/24/21 1015   alum & mag hydroxide-simeth (MAALOX/MYLANTA) 200-200-20 MG/5ML suspension 30 mL  30 mL Oral Q4H PRN Estella Husk, MD       bictegravir-emtricitabine-tenofovir AF (BIKTARVY) 50-200-25 MG per tablet 1 tablet  1 tablet Oral Daily Estella Husk, MD   1 tablet at 02/25/21 0786    hydrocerin (EUCERIN) cream 1 application  1 application Topical BH-qamhs Lenard Lance, FNP   1 application at 02/24/21 2141   hydrOXYzine (ATARAX/VISTARIL) tablet 25 mg  25 mg Oral TID PRN Estella Husk, MD   25 mg at 02/22/21 2106   lisinopril (ZESTRIL) tablet 20 mg  20 mg Oral Daily Estella Husk, MD   20 mg at 02/25/21 7544   risperiDONE (RISPERDAL M-TABS) disintegrating tablet 2 mg  2 mg Oral Q8H PRN Lenard Lance, FNP       And   LORazepam (ATIVAN) tablet 1 mg  1 mg Oral PRN Lenard Lance, FNP       And   ziprasidone (GEODON) injection 20 mg  20 mg Intramuscular PRN Lenard Lance, FNP       magnesium hydroxide (MILK OF MAGNESIA) suspension 30 mL  30 mL Oral Daily PRN Estella Husk, MD   30 mL at 02/24/21 1630   potassium chloride SA (KLOR-CON) CR tablet 20 mEq  20 mEq Oral BID Estella Husk, MD   20 mEq at 02/25/21 9201   risperiDONE (RISPERDAL) tablet 1 mg  1 mg Oral BID Estella Husk, MD   1 mg at 02/25/21 0071   rosuvastatin (CRESTOR) tablet 40 mg  40 mg Oral Daily Estella Husk, MD   40 mg at 02/25/21 2197   valACYclovir (VALTREX) tablet 500 mg  500 mg Oral BID Lenard Lance, FNP   500 mg at 02/25/21 5883   vitamin B-12 (CYANOCOBALAMIN) tablet 1,000 mcg  1,000 mcg Oral Daily Lenard Lance, FNP   1,000 mcg at 02/25/21 2549   Current Outpatient Medications  Medication Sig Dispense Refill   betamethasone valerate ointment (VALISONE) 0.1 % Apply 1 application topically daily as needed. Apply to affected area.     bictegravir-emtricitabine-tenofovir AF (BIKTARVY) 50-200-25 MG TABS tablet Take 1 tablet by mouth daily.     Emollient (EUCERIN INTENSIVE REPAIR) LOTN Apply 1 Bottle topically 2 (two) times daily in the am and at bedtime.Marland Kitchen Apply to affected area.     ergocalciferol (VITAMIN D2) 1.25 MG (50000 UT) capsule Take 50,000 Units by mouth every Monday.     ketoconazole (NIZORAL) 2 % cream Apply 1 application topically daily. Apply to affected  area.     lisinopril-hydrochlorothiazide (ZESTORETIC) 20-12.5 MG tablet Take 1 tablet by mouth daily.     potassium chloride SA (KLOR-CON) 20 MEQ tablet Take 20 mEq by mouth daily.     rosuvastatin (CRESTOR) 40 MG tablet Take 40 mg by mouth daily.     valACYclovir (VALTREX) 500 MG tablet Take 1 tablet (500 mg  total) by mouth 2 (two) times daily. For herpes     vitamin B-12 (CYANOCOBALAMIN) 1000 MCG tablet Take 1,000 mcg by mouth daily.      Labs  Lab Results:  Admission on 02/21/2021, Discharged on 02/22/2021  Component Date Value Ref Range Status   Sodium 02/21/2021 139  135 - 145 mmol/L Final   Potassium 02/21/2021 3.0 (A) 3.5 - 5.1 mmol/L Final   Chloride 02/21/2021 101  98 - 111 mmol/L Final   CO2 02/21/2021 27  22 - 32 mmol/L Final   Glucose, Bld 02/21/2021 92  70 - 99 mg/dL Final   Glucose reference range applies only to samples taken after fasting for at least 8 hours.   BUN 02/21/2021 12  6 - 20 mg/dL Final   Creatinine, Ser 02/21/2021 1.02  0.61 - 1.24 mg/dL Final   Calcium 52/77/8242 9.4  8.9 - 10.3 mg/dL Final   Total Protein 35/36/1443 8.1  6.5 - 8.1 g/dL Final   Albumin 15/40/0867 4.5  3.5 - 5.0 g/dL Final   AST 61/95/0932 42 (A) 15 - 41 U/L Final   ALT 02/21/2021 35  0 - 44 U/L Final   Alkaline Phosphatase 02/21/2021 58  38 - 126 U/L Final   Total Bilirubin 02/21/2021 0.9  0.3 - 1.2 mg/dL Final   GFR, Estimated 02/21/2021 >60  >60 mL/min Final   Comment: (NOTE) Calculated using the CKD-EPI Creatinine Equation (2021)    Anion gap 02/21/2021 11  5 - 15 Final   Performed at [facility], [address], [place], [place] [address]   Alcohol, Ethyl (B) 02/21/2021 <10  <10 mg/dL Final   Comment: (NOTE) Lowest detectable limit for serum alcohol is 10 mg/dL.  For medical purposes only. Performed at [facility], [address], [place], [place] [address]    Opiates 02/21/2021 NONE DETECTED  NONE DETECTED Final   Cocaine 02/21/2021  POSITIVE (A) NONE DETECTED Final   Benzodiazepines 02/21/2021 NONE DETECTED  NONE DETECTED Final   Amphetamines 02/21/2021 NONE DETECTED  NONE DETECTED Final   Tetrahydrocannabinol 02/21/2021 POSITIVE (A) NONE DETECTED Final   Barbiturates 02/21/2021 NONE DETECTED  NONE DETECTED Final   Comment: (NOTE) DRUG SCREEN FOR MEDICAL PURPOSES ONLY.  IF CONFIRMATION IS NEEDED FOR ANY PURPOSE, NOTIFY LAB WITHIN 5 DAYS.  LOWEST DETECTABLE LIMITS FOR URINE DRUG SCREEN Drug Class                     Cutoff (ng/mL) Amphetamine and metabolites    1000 Barbiturate and metabolites    200 Benzodiazepine                 200 Tricyclics and metabolites     300 Opiates and metabolites        300 Cocaine and metabolites        300 THC                            50 Performed at [facility], [address], [place], [place] [address]    WBC 02/21/2021 8.5  4.0 - 10.5 K/uL Final   RBC 02/21/2021 4.43  4.22 - 5.81 MIL/uL Final   Hemoglobin 02/21/2021 13.9  13.0 - 17.0 g/dL Final   HCT 50/53/9767 40.5  39.0 - 52.0 % Final   MCV 02/21/2021 91.4  80.0 - 100.0 fL Final   MCH 02/21/2021 31.4  26.0 - 34.0 pg Final  MCHC 02/21/2021 34.3  30.0 - 36.0 g/dL Final   RDW 16/10/960408/07/2020 14.7  11.5 - 15.5 % Final   Platelets 02/21/2021 270  150 - 400 K/uL Final   nRBC 02/21/2021 0.0  0.0 - 0.2 % Final   Neutrophils Relative % 02/21/2021 61  % Final   Neutro Abs 02/21/2021 5.3  1.7 - 7.7 K/uL Final   Lymphocytes Relative 02/21/2021 29  % Final   Lymphs Abs 02/21/2021 2.5  0.7 - 4.0 K/uL Final   Monocytes Relative 02/21/2021 7  % Final   Monocytes Absolute 02/21/2021 0.6  0.1 - 1.0 K/uL Final   Eosinophils Relative 02/21/2021 1  % Final   Eosinophils Absolute 02/21/2021 0.0  0.0 - 0.5 K/uL Final   Basophils Relative 02/21/2021 1  % Final   Basophils Absolute 02/21/2021 0.0  0.0 - 0.1 K/uL Final   Immature Granulocytes 02/21/2021 1  % Final   Abs Immature Granulocytes 02/21/2021 0.08 (A) 0.00 -  0.07 K/uL Final   Performed at [facility], [address] [place], [place] [address]   SARS Coronavirus 2 by RT PCR 02/22/2021 NEGATIVE  NEGATIVE Final   Comment: (NOTE) SARS-CoV-2 target nucleic acids are NOT DETECTED.  The SARS-CoV-2 RNA is generally detectable in upper respiratory specimens during the acute phase of infection. The lowest concentration of SARS-CoV-2 viral copies this assay can detect is 138 copies/mL. A negative result does not preclude SARS-Cov-2 infection and should not be used as the sole basis for treatment or other patient management decisions. A negative result may occur with  improper specimen collection/handling, submission of specimen other than nasopharyngeal swab, presence of viral mutation(s) within the areas targeted by this assay, and inadequate number of viral copies(<138 copies/mL). A negative result must be combined with clinical observations, patient history, and epidemiological information. The expected result is Negative.  Fact Sheet for Patients:  BloggerCourse.comhttps://www.fda.gov/media/152166/download  Fact Sheet for Healthcare Providers:  SeriousBroker.ithttps://www.fda.gov/media/152162/download  This test is no                          t yet approved or cleared by the [place] FDA and  has been authorized for detection and/or diagnosis of SARS-CoV-2 by FDA under an Emergency Use Authorization (EUA). This EUA will remain  in effect (meaning this test can be used) for the duration of the COVID-19 declaration under Section 564(b)(1) of the Act, 21 U.S.C.section 360bbb-3(b)(1), unless the authorization is terminated  or revoked sooner.       Influenza A by PCR 02/22/2021 NEGATIVE  NEGATIVE Final   Influenza B by PCR 02/22/2021 NEGATIVE  NEGATIVE Final   Comment: (NOTE) The Xpert Xpress SARS-CoV-2/FLU/RSV plus assay is intended as an aid in the diagnosis of influenza from Nasopharyngeal swab specimens and should not be used as a sole basis  for treatment. Nasal washings and aspirates are unacceptable for Xpert Xpress SARS-CoV-2/FLU/RSV testing.  Fact Sheet for Patients: BloggerCourse.comhttps://www.fda.gov/media/152166/download  Fact Sheet for Healthcare Providers: SeriousBroker.ithttps://www.fda.gov/media/152162/download  This test is not yet approved or cleared by the [place] FDA and has been authorized for detection and/or diagnosis of SARS-CoV-2 by FDA under an Emergency Use Authorization (EUA). This EUA will remain in effect (meaning this test can be used) for the duration of the COVID-19 declaration under Section 564(b)(1) of the Act, 21 U.S.C. section 360bbb-3(b)(1), unless the authorization is terminated or revoked.  Performed at [facility], [address] [place], [place] [address]  Blood Alcohol level:  Lab Results  Component Value Date   ETH <10 02/21/2021   ETH <11 08/08/2013    Metabolic Disorder Labs: No results found for: HGBA1C, MPG No results found for: PROLACTIN No results found for: CHOL, TRIG, HDL, CHOLHDL, VLDL, LDLCALC  Therapeutic Lab Levels: No results found for: LITHIUM No results found for: VALPROATE No components found for:  CBMZ  Physical Findings   AUDIT    Flowsheet Row [facility]  Alcohol Use Disorder Identification Test Final Score (AUDIT) 22 22      Flowsheet Row ED from [facility]2021 in [facility] from [facility]21/2021 [facility]  C-SSRS RISK CATEGORY No Risk No Risk        Musculoskeletal  Strength & Muscle Tone: within normal limits Gait & Station: normal Patient leans: N/A  Psychiatric Specialty Exam  Presentation  General Appearance: Appropriate for Environment; Casual  Eye Contact:Good  Speech:Clear and Coherent; Normal Rate  Speech  Volume:Normal  Handedness:Right   Mood and Affect  Mood:Euthymic  Affect:Appropriate; Congruent   Thought Process  Thought Processes:Coherent; Goal Directed; Linear  Descriptions of Associations:Intact  Orientation:Full (Time, Place and Person)  Thought Content:WDL (focused on housing)     Hallucinations:Hallucinations: None  Ideas of Reference:None  Suicidal Thoughts:Suicidal Thoughts: No  Homicidal Thoughts:Homicidal Thoughts: Yes, Passive (passive HI towards housing authority, no plan or intent expressed) HI Active Intent and/or Plan: Without Intent; Without Plan HI Passive Intent and/or Plan: Without Intent; Without Plan   Sensorium  Memory:Immediate Good; Recent Good; Remote Good  Judgment:Fair  Insight:Fair   Executive Functions  Concentration:Good  Attention Span:Good  Recall:Good  Fund of Knowledge:Good  Language:Good   Psychomotor Activity  Psychomotor Activity:Psychomotor Activity: Normal   Assets  Assets:Communication Skills; Desire for Improvement; Physical Health   Sleep  Sleep:Sleep: Fair   No data recorded  Physical Exam  Physical Exam Constitutional:      Appearance: Normal appearance.  HENT:     Head: Normocephalic and atraumatic.  Eyes:     Extraocular Movements: Extraocular movements intact.  Pulmonary:     Effort: Pulmonary effort is normal.  Neurological:     General: No focal deficit present.     Mental Status: He is alert and oriented to person, place, and time.  Psychiatric:        Attention and Perception: Attention and perception normal.        Speech: Speech normal.        Behavior: Behavior normal. Behavior is cooperative.        Thought Content: Thought content normal.   Review of Systems  Constitutional:  Negative for chills and fever.  HENT:  Negative for hearing loss.   Eyes:  Negative for discharge and redness.  Respiratory:  Negative for cough.   Cardiovascular:  Negative for chest pain.   Gastrointestinal:  Negative for abdominal pain.  Musculoskeletal:  Positive for joint pain. Negative for myalgias.       Knee pain, attributes to long standing arthiritis  Neurological:  Negative for headaches.  Blood pressure (!) 166/83, pulse (!) 58, temperature 98.5 F (36.9 C), temperature source Oral, resp. rate 16, height  (1.753 m), weight 104.3 kg, SpO2 100 %. Body mass index is 33.97 kg/m.  Treatment Plan Summary: Mood disorder R/o bipolar disorder -pt reports hx of bipolar; unclear if patient  has experienced sx that would be consistent with manic episodes in the context of sobriety or not -Risperdal 1 mg BID; consider increasing -offered to increase dose today, patient declined -agitation protocol -there is high suspicion that patient is feigning/exaggerating some sx as he is currently homeless   HIV -Biktarvy  HTN lisinopril 20 mg   HLD crestor 40 mg   Herpes valtrex 500 mg bID   Hypokalemia -K 3.0 on 02/21/2021 -hypokalemia can result in prolonged qtc --will replete with Potsassium chlroide 20 mg BID   Polysubstance abuse -UDS+cocaine, THC -SW assisting with substance use treatment   Disposition planning is ongoing-suspect discharge will occur over the weekend-patient encouraged to continue calling [facility]-see SW note  Estella Husk, MD 02/25/2021 4:09 PM

## 2021-02-25 NOTE — Group Therapy Note (Signed)
Personal Development Wednesday - Personal Development (Establishing Goals/Identifying Values)  Date: 02/25/21  Type of Therapy/Therapeutic Modalities: Group, Solution-Focused, CBT, Motivational Interviewing   Participation Level: Active  Objective: To assist patients in identifying personal values that influence the goals they want to accomplish throughout their lives. Facilitators will guide discussion surrounding the framework of cognitive behavioral therapy - the concept our thoughts, feelings and behavioral responses are connected. Patients will reflect on how their values and goals will motivate them to a road of recovery.   Therapeutic Goals:  1. Patient will review the cognitive triangle to understand how thoughts, influence feelings and our feelings influence our behavioral responses.  2. Patient will identify personal values and discuss how they reflect in the patient's life currently.  3. Patient will identify current goals and how they can develop a strategy for identifying solutions.  4. Patient will participate in group discussion to reflect and share experiences for additional support.   Summary of Patient's Progress: Patient did not attend group

## 2021-02-25 NOTE — ED Notes (Signed)
Pt talking to self in room and singing loudly while laying in bed. No signs of acute distress noted. Will continue to monitor for safety.

## 2021-02-25 NOTE — ED Notes (Signed)
Writer overheard patient on phone stating "you can just reach over the desk and knock them [nursing staff] upside the head. They stupid". Pt was previously stating about being unhappy with being here. Food was provided to patient at all meal times and snacks. Pt states that staff "don't feed [place] here and want [place] to starve".  RN, Seria notified.

## 2021-02-25 NOTE — ED Notes (Signed)
Patient talking to himself at this time.  In bed.  No sxs of distress. Will continue to monitor for safety

## 2021-02-25 NOTE — Progress Notes (Signed)
UPDATE  CSW has followed up with multiple [facility] to determine bed availability, thus far there has been no success in finding a bed. Patient has also been calling.   CSW has called [facility] to determine bed availability in the area's shelters. According to [facility], all the area shelters have at least a two week waiting list.    CSW contacted the following residential treatment facilities:  [facility] - no beds, 3 week wait list  [facility] - no answer, left another voicemail  [facility] - no beds, 1 week wait list [facility] - no beds, 4 week wait list    Patient has been instructed to continue to contact [facility] to determine available beds. CSW will continue to follow for alternative housing.     Baldo Daub, MSW, LCSW Clinical Child psychotherapist (Facility Based Crisis) [facility]

## 2021-02-25 NOTE — ED Notes (Signed)
Pt engaged in self guided Bible study and worship. Pt talking to self in room

## 2021-02-25 NOTE — ED Notes (Addendum)
Pt A&O x4, walking around unit. No signs of acute distress noted. Pt denies SI and AVH, declines to discuss HI. Pt is intrusive, paranoid, and irritable; asking what staff uses the computers for, stating that he is unhappy with food here, is talking about staff from previous shift, and is reaching over counter at nurse's station "to show y'all what will happen when the crazy people come in here." Pt was instructed not to reach over counters and verbally redirected to attend evening group. Cecilio Asper, NP made aware. Will continue to monitor for safety.

## 2021-02-26 DIAGNOSIS — R45851 Suicidal ideations: Secondary | ICD-10-CM | POA: Diagnosis not present

## 2021-02-26 DIAGNOSIS — F1910 Other psychoactive substance abuse, uncomplicated: Secondary | ICD-10-CM | POA: Diagnosis not present

## 2021-02-26 DIAGNOSIS — F39 Unspecified mood [affective] disorder: Secondary | ICD-10-CM | POA: Diagnosis not present

## 2021-02-26 DIAGNOSIS — R45850 Homicidal ideations: Secondary | ICD-10-CM | POA: Diagnosis not present

## 2021-02-26 NOTE — ED Notes (Signed)
GIVEN LUNCH 

## 2021-02-26 NOTE — ED Notes (Signed)
Pt had pbj and fruit cup for snack.

## 2021-02-26 NOTE — Progress Notes (Addendum)
Pt is awake, alert and oriented. No signs of acute distress noted. Administered scheduled meds with no incident. Pt continues to endorse HI towards Housing authority. Pt denies SI/AVH at this time. Staff will monitor for pt's safety.

## 2021-02-26 NOTE — ED Notes (Signed)
Pt asleep in bed. Respirations even and unlabored. Will continue to monitor for safety. ?

## 2021-02-26 NOTE — Progress Notes (Signed)
CSW has attempted to contact several [facility] to attempt to find available beds for the patient at the provider's request. The patient has also reached out to several places seeking bed availabilities. No success. However, it should be noted that the patient  was informed by a [facility] located in [place], [place] that he would need to contact [facility]/Phone#: [phone] at 6:00pm to inquire about emergency shelter, which is first come first serve. CSW attempted to contact the facility, however no answer. CSW will include the additional resources to the patient AVS to assist the patient.   [facility] ,[place], [place]    Male [address], [address] [place]  Address: [address] [place], [place] [address] Phone#: [phone] [facility] [address] [place], [place] [address] Phone#: [phone] [facility] Address: [address] [place], [place] [address] Phone#: [phone] [facility] Address: [address] [place], [place] [address]  Phone#: [phone] [facility] Address: [address], [place] [address] Phone#: [phone] [facility]  Address: [address] [place], [place] [address]  Phone#: [phone]  [facility] Address: [address] [place] [place], [place] [address] Phone#: [phone] [facility] Address: [address] [place] [place], [place] [address] Phone#: [phone] [facility] Address: [address] [place], [place] [address] Phone#: [phone] [facility] Address: [address] [place] [place], [place] [address] Phone#: [phone] [facility]  Address: [address] [place], [place] [address] Phone#: [phone] [facility] [place] Address: [address] [place], [place] [address] Phone#: [phone]   : [facility] are peer-driven, Database administrator run, and self-supported group residences  for individuals in recovery from a substance use disorder.  Basic House Rules:  Houses Engineer, civil (consulting), electing house officers who serve six-month terms. Houses are financially self-supporting; members split house expenses, which average $110 to $160 per person weekly. Any Resident who has a recurrence of the use of alcohol and/or illicit drugs must be immediately expelled    Crissie Reese, MSW, LCSW-A, LCAS-A Phone: [phone] Disposition/TOC

## 2021-02-26 NOTE — Group Therapy Note (Signed)
Wellness Friday - Wellness (8 Dimensions of Wellness)  Date: 02/26/21  Type of Therapy/Therapeutic Modalities: Psycho-Educational, Supportive, Motivational Interviewing; Holistic   Participation Level: Active  Objective: To challenge patients to reflect on their recovery process utilizing a holistic approach by reviewing the 8 Dimensions of Wellness to assist them in making positive changes in their lives by impacting the mind, body, and spirit. The 8 Dimensions of wellness include: emotional, occupational, physical, social, intellectual, and spiritual.   Therapeutic Goals:  1. Patient will learn about the different dimensions and what factors contribute to each respectively.  2. Patient will explore which of the 8 dimensions are personally exceptional and those that they may struggle maintaining due to various factors, including mental health issues.  3. Patient will discuss how they plans to enhance each of the 8 dimensions to achieve overall wellness.  Summary of Patient's Progress: Pt attended and engaged in wrap up group. Pt stated that his goal today was to reach out to contacts for housing. He reports that he did hear back from a few places, but needed to follow back up at a later time. He shared that singing and interacting with his peers are a positive things that happened for him today. Tomorrow, he wants to focus on relaxing and recharging and preparing to follow up with housing contacts. Pt rated his day a 10/10.

## 2021-02-26 NOTE — ED Notes (Signed)
GIVEN BREAKFAST  

## 2021-02-26 NOTE — ED Notes (Signed)
Transfer of patient care assumed with patient observed ambulating in open space.  Patient is wide and talkative.  Elevated when Clinical research associate introduces herself and asks, "Wow! Who put you with me"?  Patient proceeded to day room and began interacting with male peer.  Patient began praying and singing loudly. Patient is alert and oriented with demonstrated ability to articulate needs.  Denies SI, HI, AVH,  No reported pain, discomfort or disturbance at this time.  No further needs or concerns evident.

## 2021-02-26 NOTE — Progress Notes (Signed)
Pt is visible on the unit and made multiple phone calls. No signs of acute distress noted. Pt's safety is maintained.

## 2021-02-26 NOTE — Group Therapy Note (Signed)
Personal Development Wednesday - Personal Development (Establishing Goals/Identifying Values)  Date: 02/26/21  Type of Therapy/Therapeutic Modalities: Group, Solution-Focused, CBT, Motivational Interviewing   Participation Level: Active  Objective: To assist patients in identifying personal values that influence the goals they want to accomplish throughout their lives. Facilitators will guide discussion surrounding the framework of cognitive behavioral therapy - the concept our thoughts, feelings and behavioral responses are connected. Patients will reflect on how their values and goals will motivate them to a road of recovery.   Therapeutic Goals:  1. Patient will review the cognitive triangle to understand how thoughts, influence feelings and our feelings influence our behavioral responses.  2. Patient will identify personal values and discuss how they reflect in the patient's life currently.  3. Patient will identify current goals and how they can develop a strategy for identifying solutions.  4. Patient will participate in group discussion to reflect and share experiences for additional support.   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 Goals include working on anger and relationships with others. Wants to maintain his faith. Working on regulating emotions.

## 2021-02-26 NOTE — Progress Notes (Signed)
CSW faxed a referral to [facility] in [facility] fax#: [phone] at the providers request to assist with finding placement for substance abuse treatment for this patient.   Crissie Reese, MSW, LCSW-A, LCAS-A Phone: [phone] Disposition/TOC

## 2021-02-26 NOTE — Progress Notes (Signed)
CSW provided the following resources at the provider's request for the patient to follow-up with upon discharge.  Shelter List:   [facility]) [address] [place], [place] Phone: [phone]   [facility] has been providing emergency shelter to those in need of a permanent residence for over 35 years. The [employer] shelter plays an important role in our community.   There are many life events that can pull someone into a downward spiral towards poverty that is very difficult to get out of. Homelessness is a problem that can affect anyone of [place]. [employer] is a safe and comforting place to stay, especially if you have experienced the hardship of street life.   [employer] provides a single bed and bedding to 100 adult men and women. The shelter welcomes all who are in need of housing, no one in real need is turned away unless space is not available.   While staying at [facility], guests are offered more than just a bed for a night. Hot meals are provided and every guest has access to case management services. Case managers provide assistance with finding housing, employment, or other services that will help them gain stability. Continuous stay is based on availability, capacity, and progress towards goals.   To contact the front desk of [facility] please call   [phone] ext [phone] or ext. [phone].   [facility] [address] [place], [place] [address] Phone: [phone]   [facility] [address], [place] [address] Phone: [phone]    [facility] [address], [place], [place] [address] Check in at 6:00PM for placement at a local shelter) Phone: [phone]     [facility]          -(Please Call) Phone: [phone]   [facility] Eligibility:  Must be drug and alcohol free for  at least 14 days or more at the time of application. This program serves males.  Houses Engineer, civil (consulting), Economist who serve six-month terms.  Houses are financially self-supporting; members split house expenses, which average $90.00 to $130.00 per person per week.  Any Resident who relapses must be immediately expelled. Call:  [phone]    Substance Abuse Services:   [facility] - Admissions are currently completed Monday through Friday at 8am; both appointments and walk-ins are accepted.  Any individual that is a [facility] resident may present for a substance abuse screening and assessment for admission.  A person may be referred by numerous sources or self-refer.   Potential clients will be screened for medical necessity and appropriateness for the program.  Clients must meet criteria for high-intensity residential treatment services.  If clinically appropriate, a client will continue with the comprehensive clinical assessment and intake process, as well as enrollment in the [facility].  Address: [address] [place], [place] [address] Admin Hours: Mon-Fri 8AM to [facility] Hours: 24/7 Phone: [phone] Fax: [phone]  [facility] (Detox) Facility Based Crisis:  These are 3 locations for services: Please call before arrival   Address: [address] [place], [place] [address] Phone: [phone]  Address: [address] [place], [place] [address] Phone#: [phone]  Address: [address] [place], [place] [address] Phone#: [phone]   [facility] ([facility]): (offers outpatient therapy and intensive outpatient substance abuse  therapy).  [address] [place], [place] [address] Phone: [phone]  [facility]: Offers FREE recovery skills classes, support groups, 1:1 Peer Support, and Compeer Classes. [address] [place], [place]  [address] Phone: [phone] (Call to complete intake).  [facility] [address] [place], [place] [address] Phone: [phone] ext: [phone] The [facility] provides food, shelter and other programs and services to the homeless men of [place] through our [employer].  By offering safe shelter, three meals a day, clean clothing, Biblical counseling, financial planning, vocational training, GED/education and employment assistance, we've helped mend the shattered lives of many homeless men since opening in 1974.  We have approximately 267 beds available, with a max of 312 beds including mats for emergency situations and currently house an average of 270 men a night.  Prospective Client Check-In Information Photo ID Required (State/ Out of State/ [facility]) - if photo ID is not available, clients are required to have a printout of a police/sheriff's criminal history report. Help out with chores around the [facility]. No sex offender of any type (pending, charged, registered and/or any other sex related offenses) will be permitted to check in. Must be willing to abide by all rules, regulations, and policies established by the [employer]. The following will be provided - shelter, food, clothing, and biblical counseling. If you or someone you know is in need of assistance at our [facility] shelter in [place], [place], please call [phone] ext. [phone].  [facility]-will provide timely access to mental health services for children and adolescents (4-17) and adults presenting in a mental health crisis. The program is designed for those who need urgent Behavioral Health or Substance Use treatment and are not experiencing a medical crisis that would typically require an emergency room visit.    [address] [place], [place] [address] Phone: [phone] [email or web address]  [facility]: Phone#:  [phone]  The [facility] SA Intensive Outpatient Program (SAIOP) means structured individual and group addiction activities and services that are provided at an outpatient program designed to assist adult and adolescent consumers to begin recovery and learn skills for recovery maintenance. The [employer] SAIOP program is offered at least 3 hours a day, 3 days a week.SAIOP services shall include a structured program consisting of, but not limited to, the following services: Individual counseling and support; Group counseling and support; Family counseling, training or support; Biochemical assays to identify recent drug use (e.g., urine drug screens); Strategies for relapse prevention to include community and social support systems in treatment; Life skills; Crisis contingency planning; Disease Management; and Treatment support activities that have been adapted or specifically designed for persons with physical disabilities, or persons with co-occurring disorders of mental illness and substance abuse/dependence or mental retardation/developmental disability and substance abuse/dependence. Phone: [phone]  Address:   [facility] will also offer the following outpatient services: (Monday through Friday 8am-5pm)   Partial Hospitalization Program (PHP) Substance Abuse Intensive Outpatient Program (SA-IOP) Group Therapy Medication Management Peer Living Room We also provide (24/7):  Assessments: Our mental health clinician and providers will conduct a focused mental health evaluation, assessing for immediate safety concerns and further mental health needs. Referral: Our team will provide resources and help connect to community based mental health treatment, when indicated, including psychotherapy, psychiatry, and other specialized behavioral health or substance use disorder services (for those not already in treatment). Transitional Care: Our team providers in  person bridging and/or telephonic follow-up during the patient's transition to outpatient services.  [facility] 24-Hour Call Center: [phone] Behavioral Health Crisis Line: [phone]  Crissie Reese, MSW, LCSW-A, [place] Phone: [phone] Disposition/TOC

## 2021-02-26 NOTE — Discharge Instructions (Addendum)
Shelter List:   [facility]) [address] [place], [place] Phone: [phone]   [facility] has been providing emergency shelter to those in need of a permanent residence for over 35 years. The [employer] shelter plays an important role in our community.   There are many life events that can pull someone into a downward spiral towards poverty that is very difficult to get out of. Homelessness is a problem that can affect anyone of [place]. [employer] is a safe and comforting place to stay, especially if you have experienced the hardship of street life.   [employer] provides a single bed and bedding to 100 adult men and women. The shelter welcomes all who are in need of housing, no one in real need is turned away unless space is not available.   While staying at [facility], guests are offered more than just a bed for a night. Hot meals are provided and every guest has access to case management services. Case managers provide assistance with finding housing, employment, or other services that will help them gain stability. Continuous stay is based on availability, capacity, and progress towards goals.   To contact the front desk of [facility] please call   [phone] ext [phone] or ext. [phone].   [facility] [address], [place], [place] [address] Phone: [phone]   [facility] [address], [place] [address] Phone: [phone]    [facility] [address], [place], [place] [address] Check in at 6:00PM for placement at a local shelter) Phone: [phone]     [facility]          -(Please Call) Phone: [phone]   [facility] Eligibility:  Must be drug and alcohol free for at least 14 days or more at the time of application. This program serves males.  Houses Engineer, civil (consulting),  Economist who serve six-month terms.  Houses are financially self-supporting; members split house expenses, which average $90.00 to $130.00 per person per week.  Any Resident who relapses must be immediately expelled. Call:  [phone]    Substance Abuse Services:   [facility] - Admissions are currently completed Monday through Friday at 8am; both appointments and walk-ins are accepted.  Any individual that is a [facility] resident may present for a substance abuse screening and assessment for admission.  A person may be referred by numerous sources or self-refer.   Potential clients will be screened for medical necessity and appropriateness for the program.  Clients must meet criteria for high-intensity residential treatment services.  If clinically appropriate, a client will continue with the comprehensive clinical assessment and intake process, as well as enrollment in the [facility].  Address: [address] [place], [place] [address] Admin Hours: Mon-Fri 8AM to [facility] Hours: 24/7 Phone: [phone] Fax: [phone]  [facility] (Det[facility]) Facility Based Crisis:  These are 3 locations for services: Please call before arrival   Address: [address] [place], [place] [address] Phone: [phone]  Address: [address] [place], [place] [address] Phone#: [phone]  Address: [address] [place], [place] [address] Phone#: [phone]   [facility] ([facility]): (offers outpatient therapy and intensive outpatient substance abuse therapy).  [address], [place], [place] [address] Phone: [phone]  [facility]: Offers  FREE recovery skills classes, support groups, 1:1 Peer Support, and Compeer Classes. [address] [place], [place] [address] Phone: [phone] (Call to complete intake).   [facility] [address]  [place], [place] [address] Phone: [phone] ext: [phone] The [facility] provides food, shelter and other programs and services to the homeless men of [place] through our [employer].  By offering safe shelter, three meals a day, clean clothing, Biblical counseling, financial planning, vocational training, GED/education and employment assistance, we've helped mend the shattered lives of many homeless men since opening in 1974.  We have approximately 267 beds available, with a max of 312 beds including mats for emergency situations and currently house an average of 270 men a night.  Prospective Client Check-In Information Photo ID Required (State/ Out of State/ [facility]) - if photo ID is not available, clients are required to have a printout of a police/sheriff's criminal history report. Help out with chores around the [facility]. No sex offender of any type (pending, charged, registered and/or any other sex related offenses) will be permitted to check in. Must be willing to abide by all rules, regulations, and policies established by the [employer]. The following will be provided - shelter, food, clothing, and biblical counseling. If you or someone you know is in need of assistance at our [facility] shelter in [place], [place], please call [phone] ext. [phone].  [facility]-will provide timely access to mental health services for children and adolescents (4-17) and adults presenting in a mental health crisis. The program is designed for those who need urgent Behavioral Health or Substance Use treatment and are not experiencing a medical crisis that would typically require an emergency room visit.    [address] [place], [place] [address] Phone: [phone] [email or web address]  [facility]: Phone#: [phone]  The [facility] SA Intensive Outpatient Program (SAIOP) means structured individual and  group addiction activities and services that are provided at an outpatient program designed to assist adult and adolescent consumers to begin recovery and learn skills for recovery maintenance. The [employer] SAIOP program is offered at least 3 hours a day, 3 days a week.SAIOP services shall include a structured program consisting of, but not limited to, the following services: Individual counseling and support; Group counseling and support; Family counseling, training or support; Biochemical assays to identify recent drug use (e.g., urine drug screens); Strategies for relapse prevention to include community and social support systems in treatment; Life skills; Crisis contingency planning; Disease Management; and Treatment support activities that have been adapted or specifically designed for persons with physical disabilities, or persons with co-occurring disorders of mental illness and substance abuse/dependence or mental retardation/developmental disability and substance abuse/dependence. Phone: [phone]  Address:   [facility] will also offer the following outpatient services: (Monday through Friday 8am-5pm)   Partial Hospitalization Program (PHP) Substance Abuse Intensive Outpatient Program (SA-IOP) Group Therapy Medication Management Peer Living Room We also provide (24/7):  Assessments: Our mental health clinician and providers will conduct a focused mental health evaluation, assessing for immediate safety concerns and further mental health needs. Referral: Our team will provide resources and help connect to community based mental health treatment, when indicated, including psychotherapy, psychiatry, and other specialized behavioral health or substance use disorder services (for those not already in treatment). Transitional Care: Our team providers in person bridging and/or telephonic follow-up during the patient's transition to outpatient services.  [facility] Call  Center 24-Hour Call Center: [phone] Behavioral Health Crisis Line: [phone]  [facility], [place]    Male [place]  Address: [address] [place], [place] [address] Phone#: [phone] [facility] [address] [place], [place] [address] Phone#: [phone] [facility] Address: [address] [place], [place] [address] Phone#: [phone] [facility] Address: [address] [place], [place] [address]  Phone#: [phone] [facility] Address: [address], [place] [address] Phone#: [phone] [facility]  Address: [address] [place], [place] [address]  Phone#: [phone]  [facility] Address: [address] [place]. [place], [place] [address] Phone#: [phone] [facility] Address: [address] [place], [place] [address] Phone#: [phone] [facility] Address: [address] [place], [place] [address] Phone#: [phone] [facility] Address: [address] [place] [place], [place] [address] Phone#: [phone] [facility]  Address: [address] [place], [place] [address] Phone#: [phone] [facility] Address: [address] [place], [place] [address] Phone#: [phone]    : [employer] are peer-driven, Database administrator run, and self-supported group residences for individuals in recovery from a substance use disorder.  Basic House Rules:  Houses Engineer, civil (consulting), electing house officers who serve six-month terms. Houses are financially self-supporting; members split house expenses, which average $110 to $160 per person weekly. Any Resident who has a recurrence of the use of alcohol and/or illicit drugs must be immediately expelled

## 2021-02-26 NOTE — Progress Notes (Signed)
Given dinner

## 2021-02-26 NOTE — ED Provider Notes (Signed)
Behavioral Health Progress Note  Date and Time: 02/26/2021 1:09 PM Name: Ethan [name] MRN:  [id number]  Subjective:  Patient seen and examined face to face by this provider and chart reviewed. On evaluation, the patient is alert and oriented x4.  is thought process is logical and speech is coherent. His mood is euthymic and affect is congruent. He denies suicidal ideations. He he continues to voice homicidal ideation towards [employer] housing authority with a plan to "get gasoline and blow their ass up" with intent. He states that they are wrong for getting him evicted and yes he has these threats with intent until he is able to find him a new place to stay. He denies auditory and visual hallucinations. He states that he talks to spirits and talk to God about evil spirits but he is not hallucinating. He is he does not appear to be responding to internal or external stimuli. He reports fair sleep. He reports having a good appetite. He states that he is compliant with his medications and denies any medication side effects. He states that he does not feel the need for a medication increase at this time. He states that his discharge plan is to get with the social worker on Monday to find rehab treatment or shelter because everyone in full. He was provided with the contact information for the [facility] by the CSW today to call for potential placement. He states that he called the [facility] but was told to contact Cristie Hem on Monday for a possible bed. CSW made a referral today to [facility] for substance treatment.   Diagnosis:  Final diagnoses:  Mood disorder (HCC)  Polysubstance abuse (HCC)    Total Time spent with patient: 30 minutes  Past Psychiatric History: Substance abuse  Past Medical History:  Past Medical History:  Diagnosis Date   Fungal infection    on hand   HIV infection (HCC)    Hyperlipemia    Hypertension    Kidney stone    Substance abuse (HCC)      Past Surgical History:  Procedure Laterality Date   HERNIA REPAIR     KIDNEY STONE SURGERY  2014   removed surgically - Dr Lindley Magnus   Family History: History reviewed. No pertinent family history. Family Psychiatric  History:  Social History:  Social History   Substance and Sexual Activity  Alcohol Use Yes   Comment: 1/4 gallon daily     Social History   Substance and Sexual Activity  Drug Use Yes   Types: Cocaine, Marijuana    Social History   Socioeconomic History   Marital status: Single    Spouse name: Not on file   Number of children: Not on file   Years of education: Not on file   Highest education level: Not on file  Occupational History   Not on file  Tobacco Use   Smoking status: Every Day   Smokeless tobacco: Never  Substance and Sexual Activity   Alcohol use: Yes    Comment: 1/4 gallon daily   Drug use: Yes    Types: Cocaine, Marijuana   Sexual activity: Not on file  Other Topics Concern   Not on file  Social History Narrative   Not on file   Social Determinants of Health   Financial Resource Strain: Not on file  Food Insecurity: Not on file  Transportation Needs: Not on file  Physical Activity: Not on file  Stress: Not on file  Social  Connections: Not on file   SDOH:  SDOH Screenings   Alcohol Screen: Not on file  Depression (PHQ2-9): Not on file  Financial Resource Strain: Not on file  Food Insecurity: Not on file  Housing: Not on file  Physical Activity: Not on file  Social Connections: Not on file  Stress: Not on file  Tobacco Use: High Risk   Smoking Tobacco Use: Every Day   Smokeless Tobacco Use: Never  Transportation Needs: Not on file   Additional Social History:   Sleep: Fair  Appetite:  Good  Current Medications:  Current Facility-Administered Medications  Medication Dose Route Frequency Provider Last Rate Last Admin   acetaminophen (TYLENOL) tablet 650 mg  650 mg Oral Q6H PRN Estella HuskLaubach, Katherine S, MD   650 mg at  02/25/21 1833   alum & mag hydroxide-simeth (MAALOX/MYLANTA) 200-200-20 MG/5ML suspension 30 mL  30 mL Oral Q4H PRN Estella HuskLaubach, Katherine S, MD       bictegravir-emtricitabine-tenofovir AF (BIKTARVY) 50-200-25 MG per tablet 1 tablet  1 tablet Oral Daily Estella HuskLaubach, Katherine S, MD   1 tablet at 02/26/21 [id number]   hydrocerin (EUCERIN) cream 1 application  1 application Topical BH-qamhs Lenard LanceAllen, Tina L, FNP   1 application at 02/26/21 [id number]   hydrOXYzine (ATARAX/VISTARIL) tablet 25 mg  25 mg Oral TID PRN Estella HuskLaubach, Katherine S, MD   25 mg at 02/22/21 2106   lisinopril (ZESTRIL) tablet 20 mg  20 mg Oral Daily Estella HuskLaubach, Katherine S, MD   20 mg at 02/26/21 [id number]   risperiDONE (RISPERDAL M-TABS) disintegrating tablet 2 mg  2 mg Oral Q8H PRN Lenard LanceAllen, Tina L, FNP       And   LORazepam (ATIVAN) tablet 1 mg  1 mg Oral PRN Lenard LanceAllen, Tina L, FNP       And   ziprasidone (GEODON) injection 20 mg  20 mg Intramuscular PRN Lenard LanceAllen, Tina L, FNP       magnesium hydroxide (MILK OF MAGNESIA) suspension 30 mL  30 mL Oral Daily PRN Estella HuskLaubach, Katherine S, MD   30 mL at 02/26/21 1128   potassium chloride SA (KLOR-CON) CR tablet 20 mEq  20 mEq Oral BID Estella HuskLaubach, Katherine S, MD   20 mEq at 02/26/21 [id number]   risperiDONE (RISPERDAL) tablet 1 mg  1 mg Oral BID Estella HuskLaubach, Katherine S, MD   1 mg at 02/26/21 [id number]   rosuvastatin (CRESTOR) tablet 40 mg  40 mg Oral Daily Estella HuskLaubach, Katherine S, MD   40 mg at 02/25/21 [id number]   valACYclovir (VALTREX) tablet 500 mg  500 mg Oral BID Lenard LanceAllen, Tina L, FNP   500 mg at 02/26/21 [id number]   vitamin B-12 (CYANOCOBALAMIN) tablet 1,000 mcg  1,000 mcg Oral Daily Lenard LanceAllen, Tina L, FNP   1,000 mcg at 02/26/21 [id number]   Current Outpatient Medications  Medication Sig Dispense Refill   betamethasone valerate ointment (VALISONE) 0.1 % Apply 1 application topically daily as needed. Apply to affected area.     bictegravir-emtricitabine-tenofovir AF (BIKTARVY) 50-200-25 MG TABS tablet Take 1 tablet by mouth daily.     Emollient (EUCERIN  INTENSIVE REPAIR) LOTN Apply 1 Bottle topically 2 (two) times daily in the am and at bedtime.Marland Kitchen. Apply to affected area.     ergocalciferol (VITAMIN D2) 1.25 MG (50000 UT) capsule Take 50,000 Units by mouth every Monday.     ketoconazole (NIZORAL) 2 % cream Apply 1 application topically daily. Apply to affected area.     lisinopril-hydrochlorothiazide (ZESTORETIC) 20-12.5 MG tablet Take 1 tablet by mouth  daily.     potassium chloride SA (KLOR-CON) 20 MEQ tablet Take 20 mEq by mouth daily.     rosuvastatin (CRESTOR) 40 MG tablet Take 40 mg by mouth daily.     valACYclovir (VALTREX) 500 MG tablet Take 1 tablet (500 mg total) by mouth 2 (two) times daily. For herpes     vitamin B-12 (CYANOCOBALAMIN) 1000 MCG tablet Take 1,000 mcg by mouth daily.      Labs  Lab Results:  Admission on 02/21/2021, Discharged on 02/22/2021  Component Date Value Ref Range Status   Sodium 02/21/2021 139  135 - 145 mmol/L Final   Potassium 02/21/2021 3.0 (A) 3.5 - 5.1 mmol/L Final   Chloride 02/21/2021 101  98 - 111 mmol/L Final   CO2 02/21/2021 27  22 - 32 mmol/L Final   Glucose, Bld 02/21/2021 92  70 - 99 mg/dL Final   Glucose reference range applies only to samples taken after fasting for at least 8 hours.   BUN 02/21/2021 12  6 - 20 mg/dL Final   Creatinine, Ser 02/21/2021 1.02  0.61 - 1.24 mg/dL Final   Calcium 07/37/1062 9.4  8.9 - 10.3 mg/dL Final   Total Protein 69/48/5462 8.1  6.5 - 8.1 g/dL Final   Albumin 70/35/0093 4.5  3.5 - 5.0 g/dL Final   AST 81/82/9937 42 (A) 15 - 41 U/L Final   ALT 02/21/2021 35  0 - 44 U/L Final   Alkaline Phosphatase 02/21/2021 58  38 - 126 U/L Final   Total Bilirubin 02/21/2021 0.9  0.3 - 1.2 mg/dL Final   GFR, Estimated 02/21/2021 >60  >60 mL/min Final   Comment: (NOTE) Calculated using the CKD-EPI Creatinine Equation (2021)    Anion gap 02/21/2021 11  5 - 15 Final   Performed at [facility], [address], [place], [place] [address]   Alcohol, Ethyl  (B) 02/21/2021 <10  <10 mg/dL Final   Comment: (NOTE) Lowest detectable limit for serum alcohol is 10 mg/dL.  For medical purposes only. Performed at [facility], [address], [place], [place] [address]    Opiates 02/21/2021 NONE DETECTED  NONE DETECTED Final   Cocaine 02/21/2021 POSITIVE (A) NONE DETECTED Final   Benzodiazepines 02/21/2021 NONE DETECTED  NONE DETECTED Final   Amphetamines 02/21/2021 NONE DETECTED  NONE DETECTED Final   Tetrahydrocannabinol 02/21/2021 POSITIVE (A) NONE DETECTED Final   Barbiturates 02/21/2021 NONE DETECTED  NONE DETECTED Final   Comment: (NOTE) DRUG SCREEN FOR MEDICAL PURPOSES ONLY.  IF CONFIRMATION IS NEEDED FOR ANY PURPOSE, NOTIFY LAB WITHIN 5 DAYS.  LOWEST DETECTABLE LIMITS FOR URINE DRUG SCREEN Drug Class                     Cutoff (ng/mL) Amphetamine and metabolites    1000 Barbiturate and metabolites    200 Benzodiazepine                 200 Tricyclics and metabolites     300 Opiates and metabolites        300 Cocaine and metabolites        300 THC                            50 Performed at [facility], [address] [place], [place] [address]    WBC 02/21/2021 8.5  4.0 - 10.5 K/uL Final   RBC 02/21/2021 4.43  4.22 -  5.81 MIL/uL Final   Hemoglobin 02/21/2021 13.9  13.0 - 17.0 g/dL Final   HCT 51/70/0174 40.5  39.0 - 52.0 % Final   MCV 02/21/2021 91.4  80.0 - 100.0 fL Final   MCH 02/21/2021 31.4  26.0 - 34.0 pg Final   MCHC 02/21/2021 34.3  30.0 - 36.0 g/dL Final   RDW 94/49/6759 14.7  11.5 - 15.5 % Final   Platelets 02/21/2021 270  150 - 400 K/uL Final   nRBC 02/21/2021 0.0  0.0 - 0.2 % Final   Neutrophils Relative % 02/21/2021 61  % Final   Neutro Abs 02/21/2021 5.3  1.7 - 7.7 K/uL Final   Lymphocytes Relative 02/21/2021 29  % Final   Lymphs Abs 02/21/2021 2.5  0.7 - 4.0 K/uL Final   Monocytes Relative 02/21/2021 7  % Final   Monocytes Absolute 02/21/2021 0.6  0.1 - 1.0 K/uL Final    Eosinophils Relative 02/21/2021 1  % Final   Eosinophils Absolute 02/21/2021 0.0  0.0 - 0.5 K/uL Final   Basophils Relative 02/21/2021 1  % Final   Basophils Absolute 02/21/2021 0.0  0.0 - 0.1 K/uL Final   Immature Granulocytes 02/21/2021 1  % Final   Abs Immature Granulocytes 02/21/2021 0.08 (A) 0.00 - 0.07 K/uL Final   Performed at [facility], [address] [place], [place] [address]   SARS Coronavirus 2 by RT PCR 02/22/2021 NEGATIVE  NEGATIVE Final   Comment: (NOTE) SARS-CoV-2 target nucleic acids are NOT DETECTED.  The SARS-CoV-2 RNA is generally detectable in upper respiratory specimens during the acute phase of infection. The lowest concentration of SARS-CoV-2 viral copies this assay can detect is 138 copies/mL. A negative result does not preclude SARS-Cov-2 infection and should not be used as the sole basis for treatment or other patient management decisions. A negative result may occur with  improper specimen collection/handling, submission of specimen other than nasopharyngeal swab, presence of viral mutation(s) within the areas targeted by this assay, and inadequate number of viral copies(<138 copies/mL). A negative result must be combined with clinical observations, patient history, and epidemiological information. The expected result is Negative.  Fact Sheet for Patients:  BloggerCourse.com  Fact Sheet for Healthcare Providers:  SeriousBroker.it  This test is no                          t yet approved or cleared by the [place] FDA and  has been authorized for detection and/or diagnosis of SARS-CoV-2 by FDA under an Emergency Use Authorization (EUA). This EUA will remain  in effect (meaning this test can be used) for the duration of the COVID-19 declaration under Section 564(b)(1) of the Act, 21 U.S.C.section 360bbb-3(b)(1), unless the authorization is terminated  or revoked sooner.        Influenza A by PCR 02/22/2021 NEGATIVE  NEGATIVE Final   Influenza B by PCR 02/22/2021 NEGATIVE  NEGATIVE Final   Comment: (NOTE) The Xpert Xpress SARS-CoV-2/FLU/RSV plus assay is intended as an aid in the diagnosis of influenza from Nasopharyngeal swab specimens and should not be used as a sole basis for treatment. Nasal washings and aspirates are unacceptable for Xpert Xpress SARS-CoV-2/FLU/RSV testing.  Fact Sheet for Patients: BloggerCourse.com  Fact Sheet for Healthcare Providers: SeriousBroker.it  This test is not yet approved or cleared by the [place] FDA and has been authorized for detection and/or diagnosis of SARS-CoV-2 by FDA under an Emergency Use Authorization (EUA). This EUA  will remain in effect (meaning this test can be used) for the duration of the COVID-19 declaration under Section 564(b)(1) of the Act, 21 U.S.C. section 360bbb-3(b)(1), unless the authorization is terminated or revoked.  Performed at [facility], [address] [place], [place] [address]     Blood Alcohol level:  Lab Results  Component Value Date   [facility] <10 02/21/2021   ETH <11 08/08/2013    Metabolic Disorder Labs: No results found for: HGBA1C, MPG No results found for: PROLACTIN No results found for: CHOL, TRIG, HDL, CHOLHDL, VLDL, LDLCALC  Therapeutic Lab Levels: No results found for: LITHIUM No results found for: VALPROATE No components found for:  CBMZ  Physical Findings   AUDIT    Flowsheet Row [facility]  Alcohol Use Disorder Identification Test Final Score (AUDIT) 22 22      Flowsheet Row ED from [facility]22/2021 in [facility] from [facility]21/2021 in [facility]  C-SSRS RISK CATEGORY No  Risk No Risk        Musculoskeletal  Strength & Muscle Tone: within normal limits Gait & Station: normal Patient leans: N/A  Psychiatric Specialty Exam  Presentation  General Appearance: Appropriate for Environment  Eye Contact:Fair  Speech:Clear and Coherent  Speech Volume:Normal  Handedness:Right   Mood and Affect  Mood:Euthymic  Affect:Appropriate   Thought Process  Thought Processes:Coherent; Goal Directed  Descriptions of Associations:Intact  Orientation:Full (Time, Place and Person)  Thought Content:WDL     Hallucinations:Hallucinations: None  Ideas of Reference:None  Suicidal Thoughts:Suicidal Thoughts: No  Homicidal Thoughts:Homicidal Thoughts: Yes, Active HI Active Intent and/or Plan: With Intent; With Plan HI Passive Intent and/or Plan: Without Intent; Without Plan   Sensorium  Memory:Immediate Fair; Remote Fair; Recent Fair  Judgment:Poor  Insight:Present   Executive Functions  Concentration:Fair  Attention Span:Fair  Recall:Fair  Fund of Knowledge:Fair  Language:Fair   Psychomotor Activity  Psychomotor Activity:Psychomotor Activity: Normal   Assets  Assets:Communication Skills; Desire for Improvement; Leisure Time; Physical Health   Sleep  Sleep:Sleep: Fair   No data recorded  Physical Exam  Physical Exam HENT:     Head: Normocephalic.  Eyes:     Conjunctiva/sclera: Conjunctivae normal.  Cardiovascular:     Rate and Rhythm: Normal rate.  Pulmonary:     Effort: Pulmonary effort is normal.  Musculoskeletal:        General: Normal range of motion.     Cervical back: Normal range of motion.  Neurological:     Mental Status: He is alert and oriented to person, place, and time.   Review of Systems  Constitutional: Negative.   HENT: Negative.    Eyes: Negative.   Respiratory: Negative.    Cardiovascular: Negative.   Gastrointestinal: Negative.   Genitourinary: Negative.   Musculoskeletal:        Arthritis    Skin: Negative.   Neurological: Negative.   Endo/Heme/Allergies: Negative.   Psychiatric/Behavioral:  Negative for hallucinations and suicidal ideas.   Blood pressure (!) 153/93, pulse 61, temperature 97.9 F (36.6 C), temperature source Oral, resp. rate 18, height  (1.753 m), weight 230 lb (104.3 kg), SpO2 100 %. Body mass index is 33.97 kg/m.  Treatment Plan Summary: No new medication changes. Will continue with current treatment plan:  Treatment Plan Summary: Mood disorder R/o bipolar disorder -Risperdal 1 mg BID; consider increasing -offered  to increase dose today, patient declined -agitation protocol -there is high suspicion that patient is feigning/exaggerating some sx as he is currently homeless   HIV -Biktarvy  HTN lisinopril 20 mg   HLD crestor 40 mg   Herpes valtrex 500 mg bID   Hypokalemia -K 3.0 on 02/21/2021 -hypokalemia can result in prolonged qtc --will replete with Potsassium chlroide 20 mg BID   Polysubstance abuse -UDS+cocaine, THC -SW assisting with substance use treatment   Disposition planning is ongoing-suspect discharge will occur over the weekend-patient encouraged to continue calling [facility]-see SW note.   Layla Barter, NP 02/26/2021 1:09 PM

## 2021-02-26 NOTE — ED Notes (Addendum)
Patient reports transient HI related to housing eviction and reports "thinking about blowing up the [facility] for what they did to me.  I got money from the government and paid my rent"!  Exhibits thought blocking with tangential and circumstantial speech.  Endorses religiosity and persecution with period of rambling about corrupt clergy, incest within his family, derogatory descriptions of a sister, and gaining custody of a 58-year old nephew.  He is medication compliant and tolerated whole with water.  Frequent redirection required.

## 2021-02-27 DIAGNOSIS — F39 Unspecified mood [affective] disorder: Secondary | ICD-10-CM | POA: Diagnosis not present

## 2021-02-27 DIAGNOSIS — F1910 Other psychoactive substance abuse, uncomplicated: Secondary | ICD-10-CM | POA: Diagnosis not present

## 2021-02-27 DIAGNOSIS — R45850 Homicidal ideations: Secondary | ICD-10-CM | POA: Diagnosis not present

## 2021-02-27 DIAGNOSIS — R45851 Suicidal ideations: Secondary | ICD-10-CM | POA: Diagnosis not present

## 2021-02-27 NOTE — ED Notes (Signed)
Snack given with juice

## 2021-02-27 NOTE — Group Therapy Note (Signed)
Personal Development Wednesday - Establishing Goals/Identifying Values - Wrap-up  Date: 02/27/21  Type of Therapy/Therapeutic Modalities: Group, Solution-Focused, CBT, Motivational Interviewing   Participation Level: Active  Objective: To assist patients in identifying personal values that influence the goals they want to accomplish throughout their lives. Facilitators will guide discussion surrounding the framework of cognitive behavioral therapy - the concept our thoughts, feelings and behavioral responses are connected. Patients will reflect on how their values and goals will motivate them to a road of recovery.   Summary of Patient's Progress: Pt noted that their goal was to call [facility] to learn more about their programs and availability. Goal was met

## 2021-02-27 NOTE — ED Notes (Signed)
Patient up for breakfast.  Out in dayroom.  Quiet.  Continue to monitor for safety.

## 2021-02-27 NOTE — ED Notes (Signed)
Pt complaining of stomach pain on the right side, he states his urine is dark. Reached out to NP to put in order for urinalysis to assess for bladder infection. Pt was also given cranberry juice. Pt also stated that he has not had an adequate bowel movement and asked for his PRN milk of magnesia

## 2021-02-27 NOTE — ED Notes (Signed)
Pt requested to write a note "to whoever is over this building." Pt was given pencil and paper. Pt sticker added to note and placed in pt's chart.

## 2021-02-27 NOTE — Progress Notes (Signed)
Out in dayroom interacting with a peer and talking on phone.  Attempting to arrange placement for discharge.  Attending groups.  Eating meals.  Less vocal with staff this shift than previously.  Continue to monitor for safety.

## 2021-02-27 NOTE — Progress Notes (Signed)
CSW spoke with staff from [facility]/Phone#: [phone]. It was reported that there is one bed available. Staff reported that the patient would have to present in-person to complete an application. Further, the patient would have to have $100 for an enter fee, find a job within 2 weeks, be able to pay 130 dollars a week to stay, responsible for his own food, and would not be able to be in the house from 9:00am-3:00PM. CSW notified provider of information who is going to inform the patient.  Crissie Reese, MSW, LCSW-A, LCAS-A Phone: [phone] Disposition/TOC

## 2021-02-27 NOTE — ED Notes (Signed)
Pt taking a shower 

## 2021-02-27 NOTE — ED Notes (Signed)
Pt eating lunch

## 2021-02-27 NOTE — ED Notes (Addendum)
Patient approached writer to report anxiety and hunger causing inability to sleep.  PRN Hydroxyzine 25 mg oral tablet administered.  Vital signs obtained.  Patient is hypertensive and bradycardic.  Denies any discomfort or disturbances at this time.  Provider notified of elevated BP for last 3-4 readings. Nursing will continue to monitor closely.

## 2021-02-27 NOTE — ED Notes (Signed)
Pt cooperative, states he is in a better mood and possibly has a place to go for shelter. Pt coloring in the day room.

## 2021-02-27 NOTE — Group Therapy Note (Signed)
The focus of this group is to help patients establish daily goals to achieve during treatment and discuss how the patient can incorporate goal setting into their daily lives to aide in recovery. Pt was cooperative and passionate about meeting his goal. This Clinical research associate spoke with patient about what he could do today to accomplish his goal. Pt verbalized understanding.

## 2021-02-27 NOTE — ED Provider Notes (Addendum)
Behavioral Health Progress Note  Date and Time: 02/27/2021 12:26 PM Name: Ethan [name] MRN:  [id number]  Subjective: Patient seen and examined face to face by this provider and chart reviewed. On evaluation, the patient is alert and oriented x4. His thought process is logical and speech is coherent. His mood is euthymic and affect is congruent. He denies suicidal ideations. When asked if he is having homicidal thoughts, he states "you know how I feel. " He denies CAVH but reports talking to the "spirits." He denies feeling paranoid. He does not appear to be responding to internal stimuli. He states that he is compliant with taking his medications and denies any side effects. He states that he does not need his Risperdal increased because he doesn't want to be on too much medications because he wants to be able to function. He states that he continues to search for housing but everyone keeps telling him to call back on Monday. Patient observed by staff on the unit calling several facility from the resource list provided by the CWS.  A referral for [facility] was made on 02/26/21 by the CSW. This provider followed up with the clinical social worker who states that he has not received any new updates from [facility] regarding bed availability.  Diagnosis:  Final diagnoses:  Mood disorder (HCC)  Polysubstance abuse (HCC)    Total Time spent with patient: 30 minutes  Past Psychiatric History: Substance abuse  Past Medical History:  Past Medical History:  Diagnosis Date   Fungal infection    on hand   HIV infection (HCC)    Hyperlipemia    Hypertension    Kidney stone    Substance abuse (HCC)     Past Surgical History:  Procedure Laterality Date   HERNIA REPAIR     KIDNEY STONE SURGERY  2014   removed surgically - Dr Lindley Magnus   Family History: History reviewed. No pertinent family history. Family Psychiatric  History: Unknown  Social History   Substance and Sexual Activity  Alcohol Use Yes    Comment: 1/4 gallon daily     Social History   Substance and Sexual Activity  Drug Use Yes   Types: Cocaine, Marijuana    Social History   Socioeconomic History   Marital status: Single    Spouse name: Not on file   Number of children: Not on file   Years of education: Not on file   Highest education level: Not on file  Occupational History   Not on file  Tobacco Use   Smoking status: Every Day   Smokeless tobacco: Never  Substance and Sexual Activity   Alcohol use: Yes    Comment: 1/4 gallon daily   Drug use: Yes    Types: Cocaine, Marijuana   Sexual activity: Not on file  Other Topics Concern   Not on file  Social History Narrative   Not on file   Social Determinants of Health   Financial Resource Strain: Not on file  Food Insecurity: Not on file  Transportation Needs: Not on file  Physical Activity: Not on file  Stress: Not on file  Social Connections: Not on file   SDOH:  SDOH Screenings   Alcohol Screen: Not on file  Depression (DXA1-2): Not on file  Financial Resource Strain: Not on file  Food Insecurity: Not on file  Housing: Not on file  Physical Activity: Not on file  Social Connections: Not on file  Stress: Not on file  Tobacco Use: High Risk  Smoking Tobacco Use: Every Day   Smokeless Tobacco Use: Never  Transportation Needs: Not on file   Additional Social History:   Sleep: Good  Appetite:  Good  Current Medications:  Current Facility-Administered Medications  Medication Dose Route Frequency Provider Last Rate Last Admin   acetaminophen (TYLENOL) tablet 650 mg  650 mg Oral Q6H PRN Estella HuskLaubach, Katherine S, MD   650 mg at 02/25/21 1833   alum & mag hydroxide-simeth (MAALOX/MYLANTA) 200-200-20 MG/5ML suspension 30 mL  30 mL Oral Q4H PRN Estella HuskLaubach, Katherine S, MD       bictegravir-emtricitabine-tenofovir AF (BIKTARVY) 50-200-25 MG per tablet 1 tablet  1 tablet Oral Daily Estella HuskLaubach, Katherine S, MD   1 tablet at 02/27/21 0957   hydrocerin  (EUCERIN) cream 1 application  1 application Topical BH-qamhs Lenard LanceAllen, Tina L, FNP   1 application at 02/26/21 2200   hydrOXYzine (ATARAX/VISTARIL) tablet 25 mg  25 mg Oral TID PRN Estella HuskLaubach, Katherine S, MD   25 mg at 02/27/21 0015   lisinopril (ZESTRIL) tablet 20 mg  20 mg Oral Daily Estella HuskLaubach, Katherine S, MD   20 mg at 02/27/21 0956   risperiDONE (RISPERDAL M-TABS) disintegrating tablet 2 mg  2 mg Oral Q8H PRN Lenard LanceAllen, Tina L, FNP       And   LORazepam (ATIVAN) tablet 1 mg  1 mg Oral PRN Lenard LanceAllen, Tina L, FNP       And   ziprasidone (GEODON) injection 20 mg  20 mg Intramuscular PRN Lenard LanceAllen, Tina L, FNP       magnesium hydroxide (MILK OF MAGNESIA) suspension 30 mL  30 mL Oral Daily PRN Estella HuskLaubach, Katherine S, MD   30 mL at 02/26/21 1128   potassium chloride SA (KLOR-CON) CR tablet 20 mEq  20 mEq Oral BID Estella HuskLaubach, Katherine S, MD   20 mEq at 02/27/21 0956   risperiDONE (RISPERDAL) tablet 1 mg  1 mg Oral BID Estella HuskLaubach, Katherine S, MD   1 mg at 02/27/21 0956   rosuvastatin (CRESTOR) tablet 40 mg  40 mg Oral Daily Estella HuskLaubach, Katherine S, MD   40 mg at 02/25/21 [id number]   valACYclovir (VALTREX) tablet 500 mg  500 mg Oral BID Lenard LanceAllen, Tina L, FNP   500 mg at 02/27/21 [id number]   vitamin B-12 (CYANOCOBALAMIN) tablet 1,000 mcg  1,000 mcg Oral Daily Lenard LanceAllen, Tina L, FNP   1,000 mcg at 02/27/21 [id number]   Current Outpatient Medications  Medication Sig Dispense Refill   betamethasone valerate ointment (VALISONE) 0.1 % Apply 1 application topically daily as needed. Apply to affected area.     bictegravir-emtricitabine-tenofovir AF (BIKTARVY) 50-200-25 MG TABS tablet Take 1 tablet by mouth daily.     Emollient (EUCERIN INTENSIVE REPAIR) LOTN Apply 1 Bottle topically 2 (two) times daily in the am and at bedtime.Marland Kitchen. Apply to affected area.     ergocalciferol (VITAMIN D2) 1.25 MG (50000 UT) capsule Take 50,000 Units by mouth every Monday.     ketoconazole (NIZORAL) 2 % cream Apply 1 application topically daily. Apply to affected area.      lisinopril-hydrochlorothiazide (ZESTORETIC) 20-12.5 MG tablet Take 1 tablet by mouth daily.     potassium chloride SA (KLOR-CON) 20 MEQ tablet Take 20 mEq by mouth daily.     rosuvastatin (CRESTOR) 40 MG tablet Take 40 mg by mouth daily.     valACYclovir (VALTREX) 500 MG tablet Take 1 tablet (500 mg total) by mouth 2 (two) times daily. For herpes     vitamin B-12 (CYANOCOBALAMIN) 1000 MCG  tablet Take 1,000 mcg by mouth daily.      Labs  Lab Results:  Admission on 02/21/2021, Discharged on 02/22/2021  Component Date Value Ref Range Status   Sodium 02/21/2021 139  135 - 145 mmol/L Final   Potassium 02/21/2021 3.0 (A) 3.5 - 5.1 mmol/L Final   Chloride 02/21/2021 101  98 - 111 mmol/L Final   CO2 02/21/2021 27  22 - 32 mmol/L Final   Glucose, Bld 02/21/2021 92  70 - 99 mg/dL Final   Glucose reference range applies only to samples taken after fasting for at least 8 hours.   BUN 02/21/2021 12  6 - 20 mg/dL Final   Creatinine, Ser 02/21/2021 1.02  0.61 - 1.24 mg/dL Final   Calcium 40/34/7425 9.4  8.9 - 10.3 mg/dL Final   Total Protein 95/63/8756 8.1  6.5 - 8.1 g/dL Final   Albumin 43/32/9518 4.5  3.5 - 5.0 g/dL Final   AST 84/16/6063 42 (A) 15 - 41 U/L Final   ALT 02/21/2021 35  0 - 44 U/L Final   Alkaline Phosphatase 02/21/2021 58  38 - 126 U/L Final   Total Bilirubin 02/21/2021 0.9  0.3 - 1.2 mg/dL Final   GFR, Estimated 02/21/2021 >60  >60 mL/min Final   Comment: (NOTE) Calculated using the CKD-EPI Creatinine Equation (2021)    Anion gap 02/21/2021 11  5 - 15 Final   Performed at [facility], [address] [place], [place] [address]   Alcohol, Ethyl (B) 02/21/2021 <10  <10 mg/dL Final   Comment: (NOTE) Lowest detectable limit for serum alcohol is 10 mg/dL.  For medical purposes only. Performed at [facility], [address], [place], [place] [address]    Opiates 02/21/2021 NONE DETECTED  NONE DETECTED Final   Cocaine 02/21/2021 POSITIVE  (A) NONE DETECTED Final   Benzodiazepines 02/21/2021 NONE DETECTED  NONE DETECTED Final   Amphetamines 02/21/2021 NONE DETECTED  NONE DETECTED Final   Tetrahydrocannabinol 02/21/2021 POSITIVE (A) NONE DETECTED Final   Barbiturates 02/21/2021 NONE DETECTED  NONE DETECTED Final   Comment: (NOTE) DRUG SCREEN FOR MEDICAL PURPOSES ONLY.  IF CONFIRMATION IS NEEDED FOR ANY PURPOSE, NOTIFY LAB WITHIN 5 DAYS.  LOWEST DETECTABLE LIMITS FOR URINE DRUG SCREEN Drug Class                     Cutoff (ng/mL) Amphetamine and metabolites    1000 Barbiturate and metabolites    200 Benzodiazepine                 200 Tricyclics and metabolites     300 Opiates and metabolites        300 Cocaine and metabolites        300 THC                            50 Performed at [facility], [address] [place], [place] [address]    WBC 02/21/2021 8.5  4.0 - 10.5 K/uL Final   RBC 02/21/2021 4.43  4.22 - 5.81 MIL/uL Final   Hemoglobin 02/21/2021 13.9  13.0 - 17.0 g/dL Final   HCT 20/25/4270 40.5  39.0 - 52.0 % Final   MCV 02/21/2021 91.4  80.0 - 100.0 fL Final   MCH 02/21/2021 31.4  26.0 - 34.0 pg Final   MCHC 02/21/2021 34.3  30.0 - 36.0 g/dL Final   RDW 62/37/6283 14.7  11.5 -  15.5 % Final   Platelets 02/21/2021 270  150 - 400 K/uL Final   nRBC 02/21/2021 0.0  0.0 - 0.2 % Final   Neutrophils Relative % 02/21/2021 61  % Final   Neutro Abs 02/21/2021 5.3  1.7 - 7.7 K/uL Final   Lymphocytes Relative 02/21/2021 29  % Final   Lymphs Abs 02/21/2021 2.5  0.7 - 4.0 K/uL Final   Monocytes Relative 02/21/2021 7  % Final   Monocytes Absolute 02/21/2021 0.6  0.1 - 1.0 K/uL Final   Eosinophils Relative 02/21/2021 1  % Final   Eosinophils Absolute 02/21/2021 0.0  0.0 - 0.5 K/uL Final   Basophils Relative 02/21/2021 1  % Final   Basophils Absolute 02/21/2021 0.0  0.0 - 0.1 K/uL Final   Immature Granulocytes 02/21/2021 1  % Final   Abs Immature Granulocytes 02/21/2021 0.08 (A) 0.00 - 0.07 K/uL  Final   Performed at [facility], [address] [place], [place] [address]   SARS Coronavirus 2 by RT PCR 02/22/2021 NEGATIVE  NEGATIVE Final   Comment: (NOTE) SARS-CoV-2 target nucleic acids are NOT DETECTED.  The SARS-CoV-2 RNA is generally detectable in upper respiratory specimens during the acute phase of infection. The lowest concentration of SARS-CoV-2 viral copies this assay can detect is 138 copies/mL. A negative result does not preclude SARS-Cov-2 infection and should not be used as the sole basis for treatment or other patient management decisions. A negative result may occur with  improper specimen collection/handling, submission of specimen other than nasopharyngeal swab, presence of viral mutation(s) within the areas targeted by this assay, and inadequate number of viral copies(<138 copies/mL). A negative result must be combined with clinical observations, patient history, and epidemiological information. The expected result is Negative.  Fact Sheet for Patients:  BloggerCourse.com  Fact Sheet for Healthcare Providers:  SeriousBroker.it  This test is no                          t yet approved or cleared by the [place] FDA and  has been authorized for detection and/or diagnosis of SARS-CoV-2 by FDA under an Emergency Use Authorization (EUA). This EUA will remain  in effect (meaning this test can be used) for the duration of the COVID-19 declaration under Section 564(b)(1) of the Act, 21 U.S.C.section 360bbb-3(b)(1), unless the authorization is terminated  or revoked sooner.       Influenza A by PCR 02/22/2021 NEGATIVE  NEGATIVE Final   Influenza B by PCR 02/22/2021 NEGATIVE  NEGATIVE Final   Comment: (NOTE) The Xpert Xpress SARS-CoV-2/FLU/RSV plus assay is intended as an aid in the diagnosis of influenza from Nasopharyngeal swab specimens and should not be used as a sole basis for  treatment. Nasal washings and aspirates are unacceptable for Xpert Xpress SARS-CoV-2/FLU/RSV testing.  Fact Sheet for Patients: BloggerCourse.com  Fact Sheet for Healthcare Providers: SeriousBroker.it  This test is not yet approved or cleared by the [place] FDA and has been authorized for detection and/or diagnosis of SARS-CoV-2 by FDA under an Emergency Use Authorization (EUA). This EUA will remain in effect (meaning this test can be used) for the duration of the COVID-19 declaration under Section 564(b)(1) of the Act, 21 U.S.C. section 360bbb-3(b)(1), unless the authorization is terminated or revoked.  Performed at [facility], [address], [place], [place] [address]     Blood Alcohol level:  Lab Results  Component Value Date   ETH <10  02/21/2021   ETH <11 08/08/2013    Metabolic Disorder Labs: No results found for: HGBA1C, MPG No results found for: PROLACTIN No results found for: CHOL, TRIG, HDL, CHOLHDL, VLDL, LDLCALC  Therapeutic Lab Levels: No results found for: LITHIUM No results found for: VALPROATE No components found for:  CBMZ  Physical Findings   AUDIT    Flowsheet Row [facility]  Alcohol Use Disorder Identification Test Final Score (AUDIT) 22 22      Flowsheet Row ED from 02/22/2021 in [facility] ED from [facility]/21/2021 in [facility]  C-SSRS RISK CATEGORY No Risk No Risk        Musculoskeletal  Strength & Muscle Tone: within normal limits Gait & Station: normal Patient leans: Right  Psychiatric Specialty Exam  Presentation  General Appearance: Appropriate for Environment  Eye Contact:Fair  Speech:Clear and Coherent  Speech  Volume:Normal  Handedness:Right   Mood and Affect  Mood:Euthymic  Affect:Appropriate   Thought Process  Thought Processes:Coherent; Goal Directed  Descriptions of Associations:Intact  Orientation:Full (Time, Place and Person)  Thought Content:WDL     Hallucinations:Hallucinations: None  Ideas of Reference:None  Suicidal Thoughts:Suicidal Thoughts: No  Homicidal Thoughts:Homicidal Thoughts: No HI Active Intent and/or Plan: With Intent; With Plan   Sensorium  Memory:Immediate Fair; Recent Fair; Remote Fair  Judgment:Intact  Insight:Fair   Executive Functions  Concentration:Fair  Attention Span:Fair  Recall:Fair  Fund of Knowledge:Fair  Language:Fair   Psychomotor Activity  Psychomotor Activity:Psychomotor Activity: Normal   Assets  Assets:Communication Skills; Desire for Improvement; Leisure Time; Physical Health   Sleep  Sleep:Sleep: Fair Number of Hours of Sleep: 8   No data recorded  Physical Exam  Physical Exam HENT:     Head: Normocephalic.  Eyes:     Conjunctiva/sclera: Conjunctivae normal.  Cardiovascular:     Rate and Rhythm: Normal rate.  Pulmonary:     Effort: Pulmonary effort is normal.  Musculoskeletal:        General: Normal range of motion.     Cervical back: Normal range of motion.  Neurological:     Mental Status: He is alert.   Review of Systems  Constitutional: Negative.   HENT: Negative.    Eyes: Negative.   Respiratory: Negative.    Cardiovascular: Negative.   Skin: Negative.   Neurological: Negative.   Endo/Heme/Allergies: Negative.   Psychiatric/Behavioral:  Negative for hallucinations and suicidal ideas.   Blood pressure (!) 154/94, pulse 60, temperature 98 F (36.7 C), temperature source Oral, resp. rate 18, height  (1.753 m), weight 230 lb (104.3 kg), SpO2 100 %. Body mass index is 33.97 kg/m.  Treatment Plan Summary: Daily contact with patient to assess and evaluate symptoms and progress in  treatment and Medication management  Treatment Plan Summary: No new medication changes. Will continue with current treatment plan:   Treatment Plan Summary: Mood disorder R/o bipolar disorder -Risperdal 1 mg BID; consider increasing -offered to increase dose today, patient declined -agitation protocol -there is high suspicion that patient is feigning/exaggerating some sx as he is currently homeless   HIV -Biktarvy  HTN lisinopril 20 mg   HLD crestor 40 mg   Herpes valtrex 500 mg bID   Hypokalemia -K 3.0 on 02/21/2021 -hypokalemia can result in prolonged qtc --will replete with Potsassium chlroide 20 mg BID   Polysubstance abuse -UDS+cocaine, THC -SW assisting with  substance use treatment   Disposition planning is ongoing-suspect discharge will occur over the weekend-patient encouraged to continue calling [facility]-see SW note.  Layla Barter, NP 02/27/2021 12:26 PM

## 2021-02-27 NOTE — ED Notes (Addendum)
Pt eating dinner. Pt given 2 salads, 2 sandwiches, juice

## 2021-02-27 NOTE — ED Notes (Signed)
Patient is resting calmly with no s/sx of distress or disturbance.  PRN hydroxyzine 25 mg oral tablet effective.  No further needs or concerns identified.  Nursing will continue to monitor.

## 2021-02-27 NOTE — ED Notes (Addendum)
Pt is in his room quietly, breathing is even and unlabored, environment check complete, will continue to monitor patient for safety

## 2021-02-27 NOTE — ED Notes (Signed)
Patient talking loudly on phone with family member. Appeared very irritated.  Hung up phone and went to room.  While in room patient was talking loudly and rapidly to self for an extended period of time.  When patient came out of room, patient had toilet paper in nostrils.  Patient stated he was praying too hard.

## 2021-02-27 NOTE — ED Notes (Signed)
Patient provided with sub sandwich during medication administration.

## 2021-02-27 NOTE — ED Notes (Signed)
Pt is currently sitting in the dining room with other patients attending wrap up group

## 2021-02-27 NOTE — ED Notes (Signed)
Patient is currently standing in the common area writing

## 2021-02-28 DIAGNOSIS — R45851 Suicidal ideations: Secondary | ICD-10-CM | POA: Diagnosis not present

## 2021-02-28 DIAGNOSIS — F1910 Other psychoactive substance abuse, uncomplicated: Secondary | ICD-10-CM | POA: Diagnosis not present

## 2021-02-28 DIAGNOSIS — F39 Unspecified mood [affective] disorder: Secondary | ICD-10-CM | POA: Diagnosis not present

## 2021-02-28 DIAGNOSIS — R45850 Homicidal ideations: Secondary | ICD-10-CM | POA: Diagnosis not present

## 2021-02-28 LAB — URINALYSIS, ROUTINE W REFLEX MICROSCOPIC
Bilirubin Urine: NEGATIVE
Glucose, UA: NEGATIVE mg/dL
Hgb urine dipstick: NEGATIVE
Ketones, ur: NEGATIVE mg/dL
Leukocytes,Ua: NEGATIVE
Nitrite: NEGATIVE
Protein, ur: NEGATIVE mg/dL
Specific Gravity, Urine: 1.011 (ref 1.005–1.030)
pH: 6 (ref 5.0–8.0)

## 2021-02-28 MED ORDER — HYDROXYZINE HCL 25 MG PO TABS: 25.0000 mg | ORAL_TABLET | Freq: Three times a day (TID) | ORAL | 0 refills | Status: AC | PRN

## 2021-02-28 MED ORDER — ENSURE ENLIVE PO LIQD
237.0000 mL | Freq: Three times a day (TID) | ORAL | Status: DC
  Filled 2021-02-28 (×2): qty 237

## 2021-02-28 MED ORDER — RISPERIDONE 1 MG PO TABS: 1.0000 mg | ORAL_TABLET | Freq: Two times a day (BID) | ORAL | 0 refills | Status: AC

## 2021-02-28 MED ORDER — HYDROXYZINE HCL 25 MG PO TABS: 25.0000 mg | ORAL_TABLET | Freq: Three times a day (TID) | ORAL | 0 refills | Status: DC | PRN

## 2021-02-28 NOTE — ED Provider Notes (Signed)
FBC/OBS ASAP Discharge Summary  Date and Time: 02/28/2021 10:58 PM  Name: Ethan [name]  MRN:  [id number]   Discharge Diagnoses:  Final diagnoses:  Mood disorder (HCC)  Polysubstance abuse [facility])    Subjective:  Ethan [name], 60 y.o., male patient was admitted to the facility based crisis unit on 02/22/2021 for homicidal ideations towards the housing department.  Patient seen face to face by this provider, Dr. Bronwen Betters; and chart reviewed on 02/28/21.     On today's evaluation Ethan [name] is in sitting position he is alert/oriented x 4; calm/cooperative; and mood congruent with affect.  He denies depression. Patient is speaking in a clear tone at moderate volume, and normal pace; with good eye contact.  His thought process is coherent and relevant; There is no indication that he is currently responding to internal/external stimuli or experiencing delusional thought content.  Patient denies suicidal/self-harm and paranoia.  Denies auditory and visual hallucinations.  Patient reports he prays and hears God talk to him. States that is part of his spiritual [name] not hallucinations.  Patient continues to endorse passive homicidal ideations towards the housing department.  Reports he is still angry with them but he would not act on it.  States, "I am a [place] and I would never hurt anyone else.  Patient contracts for safety for himself and others.  Denies access to weapons/firearms.  Patient has remained calm throughout assessment and has answered questions appropriately.   Patient completed discharge planning with Baldo Daub, LCSW.(See Jolans note on 02/28/2021)  Patient will spend the night at [facility] house.  He has plans to be admitted into the [facility] house in the a.m.  Patient is agreeable to discharge.     Stay Summary: Ethan [name] was admitted to facility base crisis unit for homicidal ideations and crisis management.  His home medications were restarted and Risperdal 1 mg p.o.  twice daily was initiated, which were tolerated with no adverse reactions.   Ethan [name] was discharged with current medications and was instructed on how to take medications as prescribed;  SAMPLES GIVEN for 7-day supply of hydroxyzine 25 mg PO TID PRN and Risperdal 1 mg p.o.BID, along with written prescription for 30-day supply.            Ethan [name] was evaluated for stability and plans for continued recovery upon discharge.  The following was addressed as part of his discharge planning and follow up treatment:  Employment, housing, transportation, bed availability, health status, family support, and any pending legal issues were also considered. He was offered further treatment options upon discharge including but not limited to Residential, Intensive Outpatient, Outpatient treatment, Rehabilitation services, and resources for shelters and [facility] if needed. Patient declined. Also declined [facility] and [address].     Upon completion of this admission the Ethan [name] was both mentally and medically stable for discharge denying suicidal/homicidal ideation, auditory/visual/tactile hallucinations, delusional thoughts and paranoia.      Total Time spent with patient: 30 minutes  Past Psychiatric History: substance abuse Past Medical History:  Past Medical History:  Diagnosis Date   Fungal infection    on hand   HIV infection (HCC)    Hyperlipemia    Hypertension    Kidney stone    Substance abuse (HCC)     Past Surgical History:  Procedure Laterality Date   HERNIA REPAIR     KIDNEY STONE SURGERY  2014   removed surgically - Dr Lindley Magnus   Family History: History reviewed. No  pertinent family history. Family Psychiatric History: unknown Social History:  Social History   Substance and Sexual Activity  Alcohol Use Yes   Comment: 1/4 gallon daily     Social History   Substance and Sexual Activity  Drug Use Yes   Types: Cocaine, Marijuana    Social History    Socioeconomic History   Marital status: Single    Spouse name: Not on file   Number of children: Not on file   Years of education: Not on file   Highest education level: Not on file  Occupational History   Not on file  Tobacco Use   Smoking status: Every Day   Smokeless tobacco: Never  Substance and Sexual Activity   Alcohol use: Yes    Comment: 1/4 gallon daily   Drug use: Yes    Types: Cocaine, Marijuana   Sexual activity: Not on file  Other Topics Concern   Not on file  Social History Narrative   Not on file   Social Determinants of Health   Financial Resource Strain: Not on file  Food Insecurity: Not on file  Transportation Needs: Not on file  Physical Activity: Not on file  Stress: Not on file  Social Connections: Not on file   SDOH:  SDOH Screenings   Alcohol Screen: Not on file  Depression (PHQ2-9): Not on file  Financial Resource Strain: Not on file  Food Insecurity: Not on file  Housing: Not on file  Physical Activity: Not on file  Social Connections: Not on file  Stress: Not on file  Tobacco Use: High Risk   Smoking Tobacco Use: Every Day   Smokeless Tobacco Use: Never  Transportation Needs: Not on file    Tobacco Cessation:  N/A, patient does not currently use tobacco products  Current Medications:  No current facility-administered medications for this encounter.   Current Outpatient Medications  Medication Sig Dispense Refill   betamethasone valerate ointment (VALISONE) 0.1 % Apply 1 application topically daily as needed. Apply to affected area.     bictegravir-emtricitabine-tenofovir AF (BIKTARVY) 50-200-25 MG TABS tablet Take 1 tablet by mouth daily.     Emollient (EUCERIN INTENSIVE REPAIR) LOTN Apply 1 Bottle topically 2 (two) times daily in the am and at bedtime.Marland Kitchen. Apply to affected area.     ergocalciferol (VITAMIN D2) 1.25 MG (50000 UT) capsule Take 50,000 Units by mouth every Monday.     hydrOXYzine (ATARAX/VISTARIL) 25 MG tablet Take 1  tablet (25 mg total) by mouth 3 (three) times daily as needed. 21 tablet 0   ketoconazole (NIZORAL) 2 % cream Apply 1 application topically daily. Apply to affected area.     lisinopril-hydrochlorothiazide (ZESTORETIC) 20-12.5 MG tablet Take 1 tablet by mouth daily.     potassium chloride SA (KLOR-CON) 20 MEQ tablet Take 20 mEq by mouth daily.     risperiDONE (RISPERDAL) 1 MG tablet Take 1 tablet (1 mg total) by mouth 2 (two) times daily. 14 tablet 0   rosuvastatin (CRESTOR) 40 MG tablet Take 40 mg by mouth daily.     valACYclovir (VALTREX) 500 MG tablet Take 1 tablet (500 mg total) by mouth 2 (two) times daily. For herpes     vitamin B-12 (CYANOCOBALAMIN) 1000 MCG tablet Take 1,000 mcg by mouth daily.     hydrOXYzine (ATARAX/VISTARIL) 25 MG tablet Take 1 tablet (25 mg total) by mouth 3 (three) times daily as needed for anxiety. 30 tablet 0   risperiDONE (RISPERDAL) 1 MG tablet Take 1 tablet (1 mg  total) by mouth 2 (two) times daily. 60 tablet 0    PTA Medications: (Not in a hospital admission)   Musculoskeletal  Strength & Muscle Tone: within normal limits Gait & Station: normal Patient leans: N/A  Psychiatric Specialty Exam  Presentation  General Appearance: Appropriate for Environment; Casual  Eye Contact:Good  Speech:Clear and Coherent  Speech Volume:Normal  Handedness:Right   Mood and Affect  Mood:Euthymic  Affect:Appropriate; Congruent   Thought Process  Thought Processes:Coherent; Goal Directed  Descriptions of Associations:Intact  Orientation:Full (Time, Place and Person)  Thought Content:Logical; WDL     Hallucinations:Hallucinations: None  Ideas of Reference:None  Suicidal Thoughts:Suicidal Thoughts: No  Homicidal Thoughts:Homicidal Thoughts: Yes, Passive HI Passive Intent and/or Plan: Without Intent; Without Plan; Without Means to Carry Out   Sensorium  Memory:Immediate Good; Recent Good; Remote  Good  Judgment:Intact  Insight:Fair   Executive Functions  Concentration:Good  Attention Span:Good  Recall:Good  Fund of Knowledge:Good  Language:Good   Psychomotor Activity  Psychomotor Activity:Psychomotor Activity: Normal   Assets  Assets:Communication Skills; Desire for Improvement; Leisure Time; Physical Health; Resilience; Social Support   Sleep  Sleep:Sleep: Good Number of Hours of Sleep: 8   No data recorded  Physical Exam  Physical Exam Vitals and nursing note reviewed.  Constitutional:      Appearance: He is well-developed.  HENT:     Head: Normocephalic and atraumatic.     Nose: No congestion or rhinorrhea.  Eyes:     Conjunctiva/sclera: Conjunctivae normal.  Cardiovascular:     Rate and Rhythm: Normal rate.     Heart sounds: No murmur heard. Pulmonary:     Effort: Pulmonary effort is normal. No respiratory distress.     Breath sounds: Normal breath sounds.  Abdominal:     Tenderness: There is no abdominal tenderness.  Musculoskeletal:        General: Normal range of motion.     Cervical back: Normal range of motion.  Skin:    Coloration: Skin is not jaundiced or pale.  Neurological:     Mental Status: He is alert and oriented to person, place, and time.  Psychiatric:        Attention and Perception: Attention and perception normal.        Mood and Affect: Mood normal.        Speech: Speech normal.        Behavior: Behavior normal. Behavior is cooperative.        Thought Content: Thought content includes homicidal ideation. Thought content does not include homicidal plan.        Cognition and Memory: Cognition normal.        Judgment: Judgment normal.   Review of Systems  Constitutional: Negative.  Negative for chills, diaphoresis and fever.  HENT: Negative.  Negative for hearing loss.   Eyes: Negative.   Respiratory: Negative.  Negative for cough.   Cardiovascular: Negative.  Negative for chest pain.  Musculoskeletal: Negative.    Skin: Negative.   Neurological: Negative.   Psychiatric/Behavioral: Negative.    Blood pressure (!) 167/82, pulse (!) 46, temperature 98.4 F (36.9 C), temperature source Oral, resp. rate 16, height 5\' 9"  (1.753 m), weight 230 lb (104.3 kg), SpO2 99 %. Body mass index is 33.97 kg/m.  Demographic Factors:  Male, Divorced or widowed, Low socioeconomic status, and Living alone  Loss Factors: Decrease in vocational status and Financial problems/change in socioeconomic status  Historical Factors: Impulsivity  Risk Reduction Factors:   Sense of responsibility to family, Religious  beliefs about death, Positive social support, and Positive coping skills or problem solving skills  Continued Clinical Symptoms:  Depression:   Comorbid alcohol abuse/dependence Hopelessness Impulsivity Alcohol/Substance Abuse/Dependencies  Cognitive Features That Contribute To Risk:  None    Suicide Risk:  Minimal: No identifiable suicidal ideation.  Patients presenting with no risk factors but with morbid ruminations; may be classified as minimal risk based on the severity of the depressive symptoms  Plan Of Care/Follow-up recommendations:  Activity:  as tolerated  Diet:  regular  Disposition:   Discharge patient.  Patient no longer meets criteria for inpatient psychiatric admission and is ready to be discharged home.  SAMPLES GIVEN for 7-day supply of hydroxyzine 25 mg PO TID PRN and Risperdal 1 mg p.o.BID, along with written prescription for 30-day supply.    Resources provided for outpatient psychiatric services and homeless shelters.  No evidence of imminent risk to self or others at present.    Patient does not meet criteria for psychiatric inpatient admission. Discussed crisis plan, support from social network, calling 911, coming to the Emergency Department, and calling Suicide Hotline.   Ardis Hughs, NP 02/28/2021, 10:58 PM

## 2021-02-28 NOTE — ED Provider Notes (Signed)
[facility] Discharge Suicide Risk Assessment   Principal Problem: <principal problem not specified> Discharge Diagnoses: Active Problems:   Mood disorder (HCC)   Total Time spent with patient: 20 minutes  Musculoskeletal: Strength & Muscle Tone: within normal limits Gait & Station: normal Patient leans: N/A  Psychiatric Specialty Exam  Presentation  General Appearance: Appropriate for Environment; Casual  Eye Contact:Good  Speech:Clear and Coherent  Speech Volume:Normal  Handedness:Right   Mood and Affect  Mood:Euthymic  Duration of Depression Symptoms: Less than two weeks  Affect:Appropriate; Congruent   Thought Process  Thought Processes:Coherent; Goal Directed  Descriptions of Associations:Intact  Orientation:Full (Time, Place and Person)  Thought Content:Logical; WDL  History of Schizophrenia/Schizoaffective disorder:No data recorded Duration of Psychotic Symptoms:No data recorded Hallucinations:Hallucinations: None  Ideas of Reference:None  Suicidal Thoughts:Suicidal Thoughts: No  Homicidal Thoughts:Homicidal Thoughts: Yes, Passive HI Passive Intent and/or Plan: Without Intent; Without Plan; Without Means to Carry Out   Sensorium  Memory:Immediate Good; Recent Good; Remote Good  Judgment:Intact  Insight:Fair   Executive Functions  Concentration:Good  Attention Span:Good  Recall:Good  Fund of Knowledge:Good  Language:Good   Psychomotor Activity  Psychomotor Activity:Psychomotor Activity: Normal   Assets  Assets:Communication Skills; Desire for Improvement; Leisure Time; Physical Health; Resilience; Social Support   Sleep  Sleep:Sleep: Good    Physical Exam: Physical Exam Constitutional:      Appearance: Normal appearance.  HENT:     Head: Normocephalic and atraumatic.  Eyes:     Extraocular Movements: Extraocular movements intact.  Pulmonary:     Effort: Pulmonary effort is normal.  Neurological:     Mental Status: He  is alert.   Review of Systems  Constitutional:  Negative for chills and fever.  HENT:  Negative for hearing loss.   Eyes:  Negative for discharge and redness.  Respiratory:  Negative for cough.   Cardiovascular:  Negative for chest pain.  Gastrointestinal:  Negative for abdominal pain.  Musculoskeletal:  Positive for joint pain. Negative for myalgias.       Chronic knee pain  Neurological:  Negative for headaches.  Psychiatric/Behavioral:  Positive for substance abuse. Negative for depression and suicidal ideas.   Blood pressure (!) 167/82, pulse (!) 46, temperature 98.4 F (36.9 C), temperature source Oral, resp. rate 16, height 5\' 9"  (1.753 m), weight 104.3 kg, SpO2 99 %. Body mass index is 33.97 kg/m.  Mental Status Per Nursing Assessment::   On Admission:   reported HI towards housing department and SI  Demographic Factors:  Male and Low socioeconomic status  Loss Factors: Financial problems/change in socioeconomic status  Historical Factors: Prior suicide attempts  Risk Reduction Factors:   Sense of responsibility to family, Religious beliefs about death, and Positive social support  Continued Clinical Symptoms:  Alcohol/Substance Abuse/Dependencies Previous Psychiatric Diagnoses and Treatments Medical Diagnoses and Treatments/Surgeries Impulsivity   Cognitive Features That Contribute To Risk:  Thought constriction (tunnel vision)    Suicide Risk:  Minimal: No identifiable suicidal ideation.  Patients presenting with no risk factors but with morbid ruminations; may be classified as minimal risk based on the severity of the depressive symptoms    Plan Of Care/Follow-up recommendations:  Activity:  as tolerated Diet:  regular Other:     Patient is instructed prior to discharge to: Take all medications as prescribed by his/her mental healthcare provider. Report any adverse effects and or reactions from the medicines to his/her outpatient provider  promptly. Patient has been instructed & cautioned: To not engage in alcohol and or illegal drug  use while on prescription medicines. In the event of worsening symptoms, patient is instructed to call the crisis hotline, 911 and or go to the nearest ED for appropriate evaluation and treatment of symptoms. To follow-up with his/her primary care provider for your other medical issues, concerns and or health care needs.     Ethan Husk, MD 02/28/2021, 2:43 PM

## 2021-02-28 NOTE — ED Notes (Signed)
Pt given snack. 

## 2021-02-28 NOTE — Discharge Summary (Signed)
Ethan [name] to be D/C'd Home per MD order. Discussed with the patient and all questions fully answered. An After Visit Summary was printed and given to the patient. Medication samples and scripts were also given to patient. Patient escorted out and D/C home via private auto.  Dickie La  02/28/2021 4:13 PM

## 2021-02-28 NOTE — Progress Notes (Signed)
Pt made phone calls and became agitated with the people on the phone. Pt was heard cussing, using profanity and slammed the phone.  Pt requested for medication for sleep. Pt was informed by this writer that medication for sleep are scheduled for night. Administered PRN Ativan due to severe agitation. Pt was also educated on how to communicate with people when seeking help. Pt was receptive. Staff will monitor for pt's safety.

## 2021-02-28 NOTE — Progress Notes (Signed)
CSW met with the patient to discuss discharge planning and any updates over the weekend. CSW received report that the patient potentially found alternative housing arrangements upon discharge. During the encounter, the patient informed CSW that he had a potential alternative for housing, however he was not absolute sure if he had somewhere to go.   Patient provided CSW with the contact information for a Felecia Jan [phone]) and requested CSW to call her to confirm the patient's "job" and information regarding housing. CSW attempted to contact Ms. Johnson, however there was no answer. CSW left a HIPPA compliant VM.   CSW contacted the [facility] for [facility] to inquire about bed availability. According to their receptionist, the shelter will not accept any applications until October 2022.   CSW contacted the [facility]; According to their administration, there is no beds available today. Requested a call back tomorrow.   CSW contacted the [facility] to inquire about bed availability in their [facility] residential programs. According to receptionists, administrators were currently out of the office and was informed to leave a message for a call back. CSW will continue to follow.    [facility] - no answer [facility] - at capacity  [facility] - at capacity, 2 week waitlist  [facility] - at capacity, 1 week waitlist    CSW will continue to follow upon discharge.      Radonna Ricker, MSW, LCSW Clinical Education officer, museum ([place]) [facility]

## 2021-02-28 NOTE — ED Notes (Signed)
Pt is currently in his room sleeping,breathing is even and unlabored, environment check complete/secure, will continue to monitor for safety

## 2021-02-28 NOTE — Clinical Social Work Psych Note (Signed)
CSW contacted a Felecia Jan [phone]), as patient reported this person could assist in alternative housing. There was no answer, CSW left a HIPAA compliant VM.   CSW contacted [employer] on the patient's behalf to determine Section 8 application status.   According to [employer], the patient's application is still being processed and he should hear something within the next 30 days. CSW met with patient to further discuss discharge planning. Patient then stated that "yall cant go let me go. Not right now. Not with all the government funding. Sho aint".   Patient also expressed that if he was scheduled for discharge today, he would not go. CSW notified MD and NP.     CSW will continue to follow.    Radonna Ricker, MSW, LCSW Clinical Education officer, museum ([place]) [facility]

## 2021-02-28 NOTE — Clinical Social Work Psych Note (Addendum)
CSW DISCHARGE NOTE  CSW spoke with Mrs. Carlene Coria [phone]) on the patient's behalf to determine his discharge plan. Per Mrs. Laural Benes, the patient has a "job" lined up and she was willing to assist with finding him housing. Mrs. Laural Benes shared resources for the [facility] and asked CSW to call to inquire about beds.   CSW contacted the [facility] and spoke with administrator, Marjorie Smolder [phone]) regarding bed availability. According to [facility], they do have available beds, however he would need to complete a phone screening with the patient at 9:00am on Tuesday, 03/01/21. CSW relayed this information to the patient and to his support, Carlene Coria [phone]).   Rose agreed that the patient could "spend the night" with her and her husband temporarily this evening, to ensure that the patient had somewhere to stay prior to his intake at [facility].   CSW re-iterated that the patient must complete the phone screening at 9:00am, before a decision is made on whether he can participate in the [facility] program or not. The patient and Mrs. Johnson expressed understanding.    Mrs. Laural Benes reported that she would be at the [facility] within an hour to pick the patient up. CSW informed the patient of his pick up time.   Patient was provided resources for outpatient medication management and therapy services upon his discharge for continuity of care.    There were no further questions or concerns at this time.    CSW will continue to follow until discharge.     Baldo Daub, MSW, LCSW Clinical Child psychotherapist (Facility Based Crisis) [facility]

## 2021-02-28 NOTE — ED Notes (Signed)
Pt eating breakfast 

## 2021-02-28 NOTE — ED Notes (Addendum)
Pt eating lunch; chicken salad sandwich, baked ziti, chicken and vegetables and juice.

## 2021-02-28 NOTE — Progress Notes (Signed)
Pt is awake, alert and oriented. Pt is interacting with peers and staff. Pt denies SI/AVH at this time. Pt stated that he will be homicidal if he does get his housing voucher or a place to stay. Staff will monitor for pt's safety.

## 2021-09-15 ENCOUNTER — Emergency Department (HOSPITAL_COMMUNITY): Payer: Medicaid Other

## 2021-09-15 ENCOUNTER — Other Ambulatory Visit: Payer: Self-pay

## 2021-09-15 ENCOUNTER — Emergency Department (HOSPITAL_COMMUNITY)
Admission: EM | Admit: 2021-09-15 | Discharge: 2021-09-15 | Disposition: A | Discharge status: Home / Self Care | Payer: Medicaid Other | Attending: Emergency Medicine | Admitting: Emergency Medicine

## 2021-09-15 ENCOUNTER — Encounter (HOSPITAL_COMMUNITY): Payer: Self-pay

## 2021-09-15 DIAGNOSIS — R0789 Other chest pain: Secondary | ICD-10-CM | POA: Diagnosis present

## 2021-09-15 DIAGNOSIS — Z79899 Other long term (current) drug therapy: Secondary | ICD-10-CM | POA: Diagnosis not present

## 2021-09-15 LAB — CBC WITH DIFFERENTIAL/PLATELET
Abs Immature Granulocytes: 0 10*3/uL (ref 0.00–0.07)
Basophils Absolute: 0 10*3/uL (ref 0.0–0.1)
Basophils Relative: 1 %
Eosinophils Absolute: 0 10*3/uL (ref 0.0–0.5)
Eosinophils Relative: 1 %
HCT: 35 % — ABNORMAL LOW (ref 39.0–52.0)
Hemoglobin: 12.1 g/dL — ABNORMAL LOW (ref 13.0–17.0)
Immature Granulocytes: 0 %
Lymphocytes Relative: 27 %
Lymphs Abs: 1.1 10*3/uL (ref 0.7–4.0)
MCH: 31.7 pg (ref 26.0–34.0)
MCHC: 34.6 g/dL (ref 30.0–36.0)
MCV: 91.6 fL (ref 80.0–100.0)
Monocytes Absolute: 0.5 10*3/uL (ref 0.1–1.0)
Monocytes Relative: 11 %
Neutro Abs: 2.6 10*3/uL (ref 1.7–7.7)
Neutrophils Relative %: 60 %
Platelets: 184 10*3/uL (ref 150–400)
RBC: 3.82 MIL/uL — ABNORMAL LOW (ref 4.22–5.81)
RDW: 14.1 % (ref 11.5–15.5)
WBC: 4.2 10*3/uL (ref 4.0–10.5)
nRBC: 0 % (ref 0.0–0.2)

## 2021-09-15 LAB — BASIC METABOLIC PANEL
Anion gap: 6 (ref 5–15)
BUN: 10 mg/dL (ref 6–20)
CO2: 26 mmol/L (ref 22–32)
Calcium: 9 mg/dL (ref 8.9–10.3)
Chloride: 108 mmol/L (ref 98–111)
Creatinine, Ser: 0.95 mg/dL (ref 0.61–1.24)
GFR, Estimated: >60 mL/min (ref >60)
Glucose, Bld: 104 mg/dL — ABNORMAL HIGH (ref 70–99)
Potassium: 3.6 mmol/L (ref 3.5–5.1)
Sodium: 140 mmol/L (ref 135–145)

## 2021-09-15 LAB — TROPONIN I (HIGH SENSITIVITY)
Troponin I (High Sensitivity): 12 ng/L (ref <18)
Troponin I (High Sensitivity): 12 ng/L (ref <18)

## 2021-09-15 LAB — MAGNESIUM: Magnesium: 1.8 mg/dL (ref 1.7–2.4)

## 2021-09-15 MED ORDER — ALUM & MAG HYDROXIDE-SIMETH 200-200-20 MG/5ML PO SUSP
30.0000 mL | Freq: Once | ORAL | Status: AC
Start: 2021-09-15 — End: 2021-09-15
  Administered 2021-09-15: 30 mL via ORAL
  Filled 2021-09-15: qty 30

## 2021-09-15 NOTE — ED Provider Notes (Signed)
[facility] EMERGENCY DEPARTMENT  Provider Note  CSN: [id number] Arrival date & time: 09/15/21 1119  History Chief Complaint  Patient presents with   Chest Pain    Ethan [name] is a 61 y.o. 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 He felt like his device shocked him (he thought it was a defibrillator too). He had another episode later in the morning. He has not had any SOB, no nausea, vomiting or diarrhea. He thought the pain might be gas pains.    Home Medications Prior to Admission medications   Medication Sig Start Date End Date Taking? Authorizing Provider  bictegravir-emtricitabine-tenofovir AF (BIKTARVY) 50-200-25 MG TABS tablet Take 1 tablet by mouth daily. 02/11/21  Yes [provider]  ergocalciferol (VITAMIN D2) 1.25 MG (50000 UT) capsule Take 50,000 Units by mouth every Monday. 09/02/19  Yes [provider]  hydrOXYzine (ATARAX/VISTARIL) 25 MG tablet Take 1 tablet (25 mg total) by mouth 3 (three) times daily as needed. Patient taking differently: Take 25 mg by mouth at bedtime. 02/28/21  Yes Revonda Humphrey, NP  latanoprost (XALATAN) 0.005 % ophthalmic solution Place 1 drop into both eyes at bedtime. 07/19/21  Yes [provider]  lisinopril-hydrochlorothiazide (ZESTORETIC) 20-12.5 MG tablet Take 1 tablet by mouth daily. 02/11/21  Yes [provider]  rosuvastatin (CRESTOR) 40 MG tablet Take 40 mg by mouth daily. 10/05/20  Yes [provider]  valACYclovir (VALTREX) 500 MG tablet Take 1 tablet (500 mg total) by mouth 2 (two) times daily. For herpes 08/14/13  Yes Lindell Spar I, NP  vitamin B-12 (CYANOCOBALAMIN) 1000 MCG tablet Take 1,000 mcg by mouth daily. 02/08/21  Yes [provider]  risperiDONE (RISPERDAL) 1 MG tablet Take 1 tablet (1 mg total) by  mouth 2 (two) times daily. Patient not taking: Reported on 09/15/2021 02/28/21   Revonda Humphrey, NP  risperiDONE (RISPERDAL) 1 MG tablet Take 1 tablet (1 mg total) by mouth 2 (two) times daily. Patient not taking: Reported on 09/15/2021 02/28/21 02/28/22  Revonda Humphrey, NP  traZODone (DESYREL) 100 MG tablet Take 1 tablet (100 mg total) by mouth at bedtime as needed for sleep. For sleep 08/04/13 08/08/13  Lindell Spar I, NP     Allergies    Trazodone and nefazodone   Review of Systems   Review of Systems Please see HPI for pertinent positives and negatives  Physical Exam BP (!) 166/109    Pulse 66    Temp 97.8 F (36.6 C) (Oral)    Resp 12    Ht 5\' 9"  (1.753 m)    Wt 104.3 kg    SpO2 98%    BMI 33.97 kg/m   Physical Exam Vitals and nursing note reviewed.  Constitutional:      Appearance: Normal appearance.  HENT:     Head: Normocephalic and atraumatic.     Nose: Nose normal.     Mouth/Throat:     Mouth: Mucous membranes are moist.  Eyes:     Extraocular Movements: Extraocular movements intact.     Conjunctiva/sclera: Conjunctivae normal.  Cardiovascular:     Rate and Rhythm: Normal rate.  Pulmonary:     Effort: Pulmonary effort is normal.     Breath sounds: Normal breath sounds.     Comments: PPM in L upper chest Abdominal:  General: Abdomen is flat.     Palpations: Abdomen is soft.     Tenderness: There is no abdominal tenderness.  Musculoskeletal:        General: No swelling. Normal range of motion.     Cervical back: Neck supple.  Skin:    General: Skin is warm and dry.  Neurological:     General: No focal deficit present.     Mental Status: He is alert.  Psychiatric:        Mood and Affect: Mood normal.    ED Results / Procedures / Treatments   EKG EKG Interpretation  Date/Time:  Thursday September 15 2021 12:12:29 EST Ventricular Rate:  66 PR Interval:  121 QRS Duration: 105 QT Interval:  429 QTC Calculation: 450 R Axis:   41 Text  Interpretation: Sinus rhythm Normal ECG No significant change since last tracing Confirmed by Calvert Cantor [phone]) on 09/15/2021 1:21:02 PM  Procedures Procedures  Medications Ordered in the ED Medications  alum & mag hydroxide-simeth (MAALOX/MYLANTA) 200-200-20 MG/5ML suspension 30 mL (30 mLs Oral Given 09/15/21 1358)    Initial Impression and Plan  Patient actually has a PPM but not an AICD. [employer] rep called, he is A-paced 49% of the time, no V-pacing. Will check labs and CXR to evaluate other causes of his discomfort. GI cocktail for symptom relief.   ED Course   Clinical Course as of 09/15/21 1521  Thu Sep 15, 2021  1318 CBC is unremarkable, mild anemia.   [CS]  [id number] BMP and Trop are normal. Mg normal.  [CS]  [id number] I personally viewed the images from radiology studies and agree with radiologist interpretation: No acute process, cardiomegaly is old  [CS]  1506 Patient reports he had a large BM and now he is feeling better. Awaiting second Trop and then plan discharge home.  [CS]  [place] of the patient signed out to Dr. Roslynn Amble at the change of shift pending second Trop.  [CS]    Clinical Course User Index [CS] Truddie Hidden, MD     MDM Rules/Calculators/A&P Medical Decision Making Given presenting complaint, I considered that admission might be necessary. After review of results from ED lab and/or imaging studies, admission to the hospital is not indicated at this time provided Trop remains normal.    Problems Addressed: Atypical chest pain: acute illness or injury that poses a threat to life or bodily functions  Amount and/or Complexity of Data Reviewed Labs: ordered. Decision-making details documented in ED Course. Radiology: ordered and independent interpretation performed. Decision-making details documented in ED Course. ECG/medicine tests: ordered and independent interpretation performed. Decision-making details documented in ED Course.  Risk OTC  drugs. Decision regarding hospitalization.    Final Clinical Impression(s) / ED Diagnoses Final diagnoses:  Atypical chest pain    Rx / DC Orders ED Discharge Orders     None        Truddie Hidden, MD 09/15/21 1521

## 2021-09-15 NOTE — ED Notes (Signed)
[employer] called and Pt information provided.  Reports they will follow-up.

## 2021-09-15 NOTE — ED Notes (Signed)
Pt provided a sandwich bag and cola at discharge.  Pt reports he is taking the bus home.  Pt shown to lobby.  Ambulatory w/o difficulty.

## 2021-09-15 NOTE — ED Notes (Signed)
Pt reports he is feeling much better since having a BM.  Pt thinks the chest pressure was "just gas."

## 2021-09-15 NOTE — ED Notes (Signed)
Pt has a [employer] pacemaker.

## 2021-09-15 NOTE — ED Notes (Signed)
Pt reports he lives in a hotel and the room is filled w/ mold.  Sts he has lived there since November.  Pt is concerned his issues are from the mold.  Pt calling hotel and trying to get moved to another room.

## 2021-09-15 NOTE — ED Notes (Signed)
This Clinical research associate spoke to [employer] Rep.  Pt only has a pacemaker.  Phone given to EDP to speak to rep.

## 2021-09-15 NOTE — ED Triage Notes (Addendum)
Per EMS, Pt, from work, presents w/ central chest pain/pressure after defibrillator went off x2.  Pain score 5/10.  Pt given 324 aspirin en route.   Atrial paced rhythm w/ EMS. Pt had a pacemaker placed in January.

## 2021-09-15 NOTE — ED Notes (Signed)
Patient transported to X-ray 

## 2021-09-15 NOTE — Discharge Instructions (Addendum)
Please follow-up with your primary care doctor as well as your cardiologist.  Come back to ER if you develop episodes of passing out, worsening chest pain, difficulty breathing or other new concerning symptom.

## 2023-10-20 IMAGING — CR DG CHEST 2V
2 series · 2 of 2 positions shown · non-contrast
Comparison: 02/12/2021.

CLINICAL DATA: chest pain

EXAM:
CHEST - 2 VIEW

[chest lat]
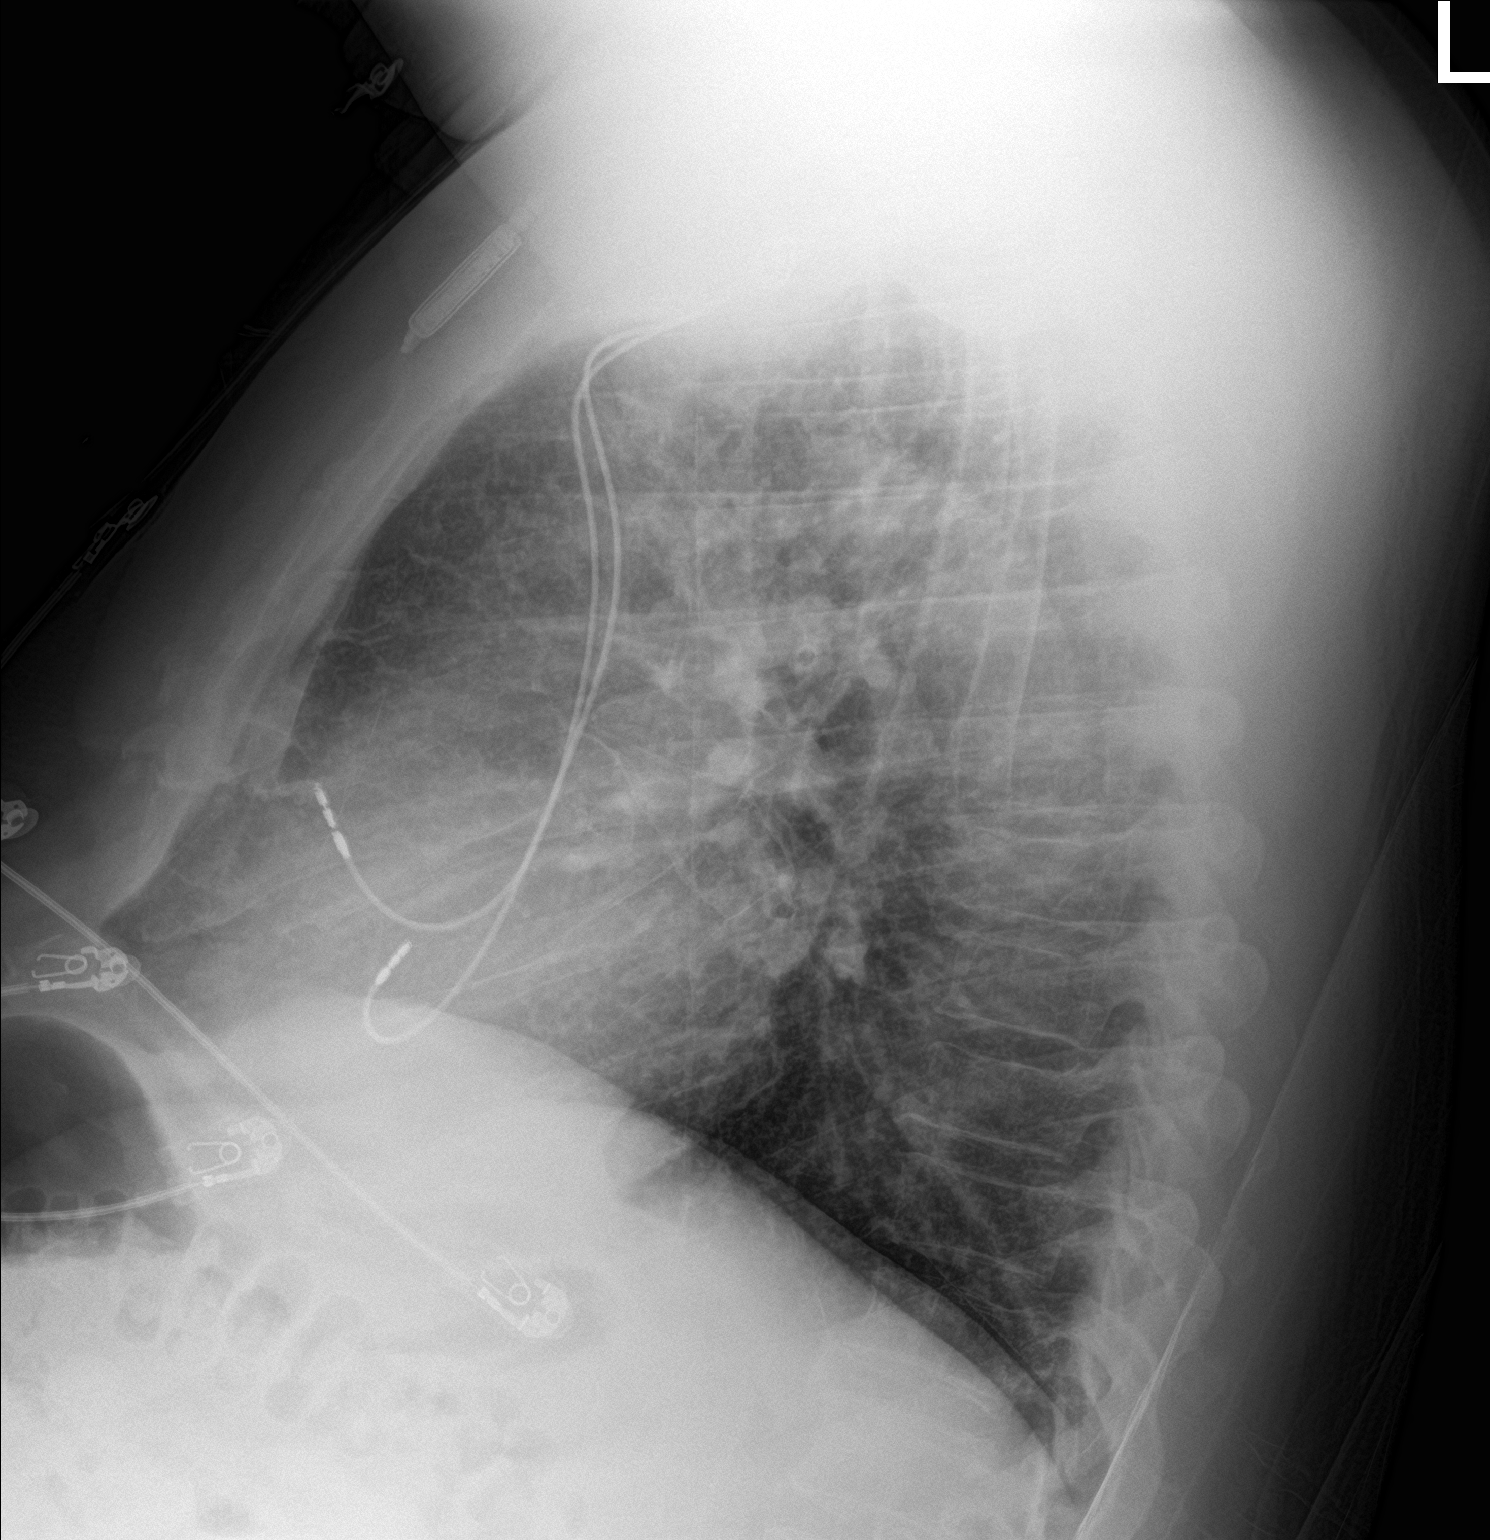

[chest ap]
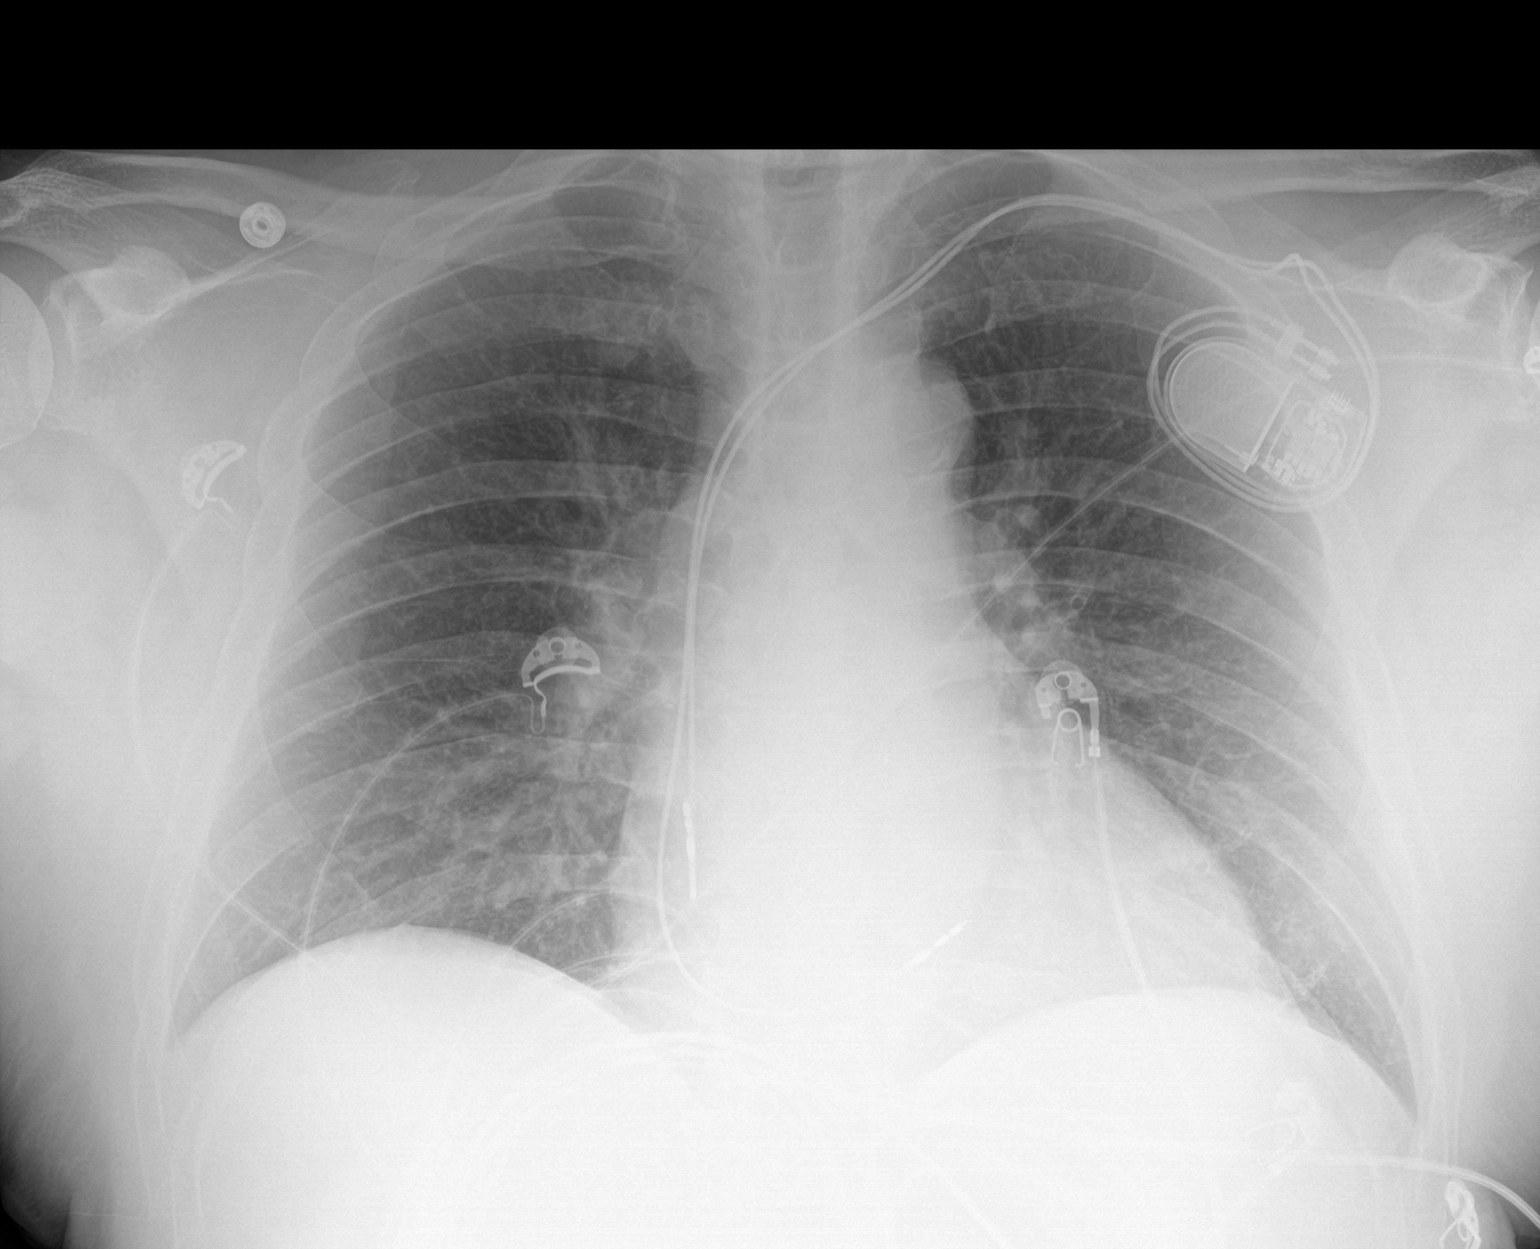

[2 of 2 positions shown; findings below may reference images not displayed]

FINDINGS: No consolidation. No visible pleural effusions or pneumothorax. Mild
enlargement of the cardiac silhouette. Left subclavian approach dual
lead cardiac rhythm maintenance device. No evidence of acute osseous
abnormality.
IMPRESSION: Mild cardiomegaly without evidence of acute cardiopulmonary disease.
